# Patient Record
Sex: Female | Born: 1945 | Race: Black or African American | Hispanic: No | Smoking: Former smoker
Health system: Southern US, Community
[De-identification: ages and names within clinical notes are randomized; demographics above are authoritative.]

## PROBLEM LIST (undated history)

## (undated) DIAGNOSIS — C50919 Malignant neoplasm of unspecified site of unspecified female breast: Secondary | ICD-10-CM

## (undated) DIAGNOSIS — Z923 Personal history of irradiation: Secondary | ICD-10-CM

---

## 2000-05-12 ENCOUNTER — Encounter: Payer: Self-pay | Admitting: Family Medicine

## 2000-05-12 ENCOUNTER — Encounter: Admission: RE | Admit: 2000-05-12 | Discharge: 2000-05-12 | Payer: Self-pay | Admitting: Family Medicine

## 2000-05-30 ENCOUNTER — Ambulatory Visit (HOSPITAL_COMMUNITY): Admission: RE | Admit: 2000-05-30 | Discharge: 2000-05-30 | Payer: Self-pay | Admitting: Otolaryngology

## 2000-05-31 ENCOUNTER — Encounter: Payer: Self-pay | Admitting: Otolaryngology

## 2006-05-03 ENCOUNTER — Encounter: Payer: Self-pay | Admitting: Family Medicine

## 2006-05-03 LAB — CONVERTED CEMR LAB: Pap Smear: NORMAL

## 2007-01-23 ENCOUNTER — Encounter: Payer: Self-pay | Admitting: Family Medicine

## 2007-01-23 ENCOUNTER — Ambulatory Visit: Payer: Self-pay | Admitting: Family Medicine

## 2007-01-23 DIAGNOSIS — F3289 Other specified depressive episodes: Secondary | ICD-10-CM | POA: Insufficient documentation

## 2007-01-23 DIAGNOSIS — E039 Hypothyroidism, unspecified: Secondary | ICD-10-CM | POA: Insufficient documentation

## 2007-01-23 DIAGNOSIS — K5900 Constipation, unspecified: Secondary | ICD-10-CM | POA: Insufficient documentation

## 2007-01-23 DIAGNOSIS — F329 Major depressive disorder, single episode, unspecified: Secondary | ICD-10-CM | POA: Insufficient documentation

## 2007-01-23 DIAGNOSIS — F411 Generalized anxiety disorder: Secondary | ICD-10-CM | POA: Insufficient documentation

## 2007-01-23 DIAGNOSIS — J309 Allergic rhinitis, unspecified: Secondary | ICD-10-CM | POA: Insufficient documentation

## 2007-01-23 DIAGNOSIS — L408 Other psoriasis: Secondary | ICD-10-CM | POA: Insufficient documentation

## 2007-01-23 LAB — CONVERTED CEMR LAB: TSH: 1.22 microintl units/mL (ref 0.35–5.50)

## 2007-02-12 ENCOUNTER — Ambulatory Visit: Payer: Self-pay | Admitting: Internal Medicine

## 2007-03-01 ENCOUNTER — Ambulatory Visit: Payer: Self-pay | Admitting: Internal Medicine

## 2007-03-13 ENCOUNTER — Encounter: Payer: Self-pay | Admitting: Internal Medicine

## 2007-03-13 ENCOUNTER — Encounter: Payer: Self-pay | Admitting: Family Medicine

## 2007-03-13 ENCOUNTER — Ambulatory Visit: Payer: Self-pay | Admitting: Internal Medicine

## 2007-03-13 DIAGNOSIS — K5730 Diverticulosis of large intestine without perforation or abscess without bleeding: Secondary | ICD-10-CM | POA: Insufficient documentation

## 2007-03-13 DIAGNOSIS — D126 Benign neoplasm of colon, unspecified: Secondary | ICD-10-CM

## 2007-10-22 ENCOUNTER — Ambulatory Visit: Payer: Self-pay | Admitting: Family Medicine

## 2008-09-10 ENCOUNTER — Encounter: Payer: Self-pay | Admitting: Family Medicine

## 2008-09-10 DIAGNOSIS — J019 Acute sinusitis, unspecified: Secondary | ICD-10-CM | POA: Insufficient documentation

## 2009-10-03 DIAGNOSIS — C50919 Malignant neoplasm of unspecified site of unspecified female breast: Secondary | ICD-10-CM

## 2009-10-03 DIAGNOSIS — Z923 Personal history of irradiation: Secondary | ICD-10-CM

## 2009-10-03 HISTORY — DX: Personal history of irradiation: Z92.3

## 2009-10-03 HISTORY — DX: Malignant neoplasm of unspecified site of unspecified female breast: C50.919

## 2009-10-03 HISTORY — PX: BREAST BIOPSY: SHX20

## 2010-10-05 ENCOUNTER — Ambulatory Visit: Payer: Self-pay | Admitting: Surgery

## 2010-10-15 ENCOUNTER — Ambulatory Visit: Payer: Self-pay | Admitting: Surgery

## 2010-10-21 ENCOUNTER — Ambulatory Visit: Payer: Self-pay | Admitting: Surgery

## 2010-10-26 LAB — PATHOLOGY REPORT

## 2010-11-02 ENCOUNTER — Ambulatory Visit: Payer: Self-pay | Admitting: Surgery

## 2010-11-05 LAB — PATHOLOGY REPORT

## 2010-11-11 ENCOUNTER — Ambulatory Visit: Payer: Self-pay | Admitting: Internal Medicine

## 2010-11-18 ENCOUNTER — Ambulatory Visit: Payer: Self-pay | Admitting: Internal Medicine

## 2010-11-19 LAB — CANCER ANTIGEN 27.29: CA 27.29: 20.1 U/mL (ref 0.0–38.6)

## 2010-12-02 ENCOUNTER — Ambulatory Visit: Payer: Self-pay | Admitting: Internal Medicine

## 2011-01-02 ENCOUNTER — Ambulatory Visit: Payer: Self-pay | Admitting: Internal Medicine

## 2011-01-03 ENCOUNTER — Ambulatory Visit: Payer: Self-pay | Admitting: Radiation Oncology

## 2011-02-01 ENCOUNTER — Ambulatory Visit: Payer: Self-pay | Admitting: Internal Medicine

## 2011-02-01 ENCOUNTER — Ambulatory Visit: Payer: Self-pay | Admitting: Radiation Oncology

## 2011-02-15 NOTE — Assessment & Plan Note (Signed)
[facility]                         GASTROENTEROLOGY OFFICE NOTE   ANNALYSA, MOHAMMAD                    MRN:          [id number]  DATE:03/01/2007                            DOB:          1946/09/06    CHIEF COMPLAINT:  Abdominal pain.   Ms. Fragoso was seen by our nurse, preparing for a colonoscopy, and she  was describing some left upper quadrant pain, so per protocol, she was  sent for an evaluation with a physician prior to her procedure.  She is  describing left upper quadrant and some left lower quadrant pain  that  is fairly intense at times that is relieved with defecation.  Dr.  Ermalene Searing did start her on some fiber and MiraLax, and that has helped  things.  There is no bleeding.  There is no obvious change in stool  caliber.  I think she has intentionally lost some weight.  Please see my  medical history form for further details.  There is some rare heartburn  symptomatology, usually just related to certain types of foods, like  spicy foods and tomato-based foods.   MEDICATIONS:  Multivitamins daily, calcium 600 plus D b.i.d., Synthroid  88 mcg twice daily.   PAST MEDICAL HISTORY:  1. Hypothyroidism.  2. Sleep apnea.  3. Depression.   No previous surgeries.   No known drug allergies.   FAMILY HISTORY:  Heart disease, breast cancer noted.  No colon cancer.   SOCIAL HISTORY:  She is married.  She is a Geographical information systems officer.  She lives  with her husband and one daughter.  No alcohol, tobacco, or drugs.   REVIEW OF SYSTEMS:  See my medical history form.   PHYSICAL EXAMINATION:  VITAL SIGNS:  Height 5 feet 3.  Weight 137  pounds.  Blood pressure 134/68, pulse 80.  GENERAL:  A well-developed and well-nourished black woman in no acute  distress.  HEENT:  Eyes anicteric.  ENT:  Normal mouth, lips, and pharynx.  NECK:  Supple without mass.  CHEST:  Clear.  HEART:  S1 and S2.  No murmurs, rubs or gallops.  ABDOMEN:  Soft and nontender.   No organomegaly.  SKIN:  Warm and dry with no rash.  PSYCH:  She is alert and oriented x3.   ASSESSMENT:  Left lower quadrant pain and some difficulty with  defecation, constipation-like symptoms.   PLAN:  Go ahead with the colonoscopy.  I do not think we need any other  studies before that.  She seems to be helped by the fiber.  Certainly,  diverticular disease and colorectal neoplasia are high on the list of  possibilities, although statistically, colorectal neoplasia is less  likely.  It needs to be excluded.  Risks, benefits and indications are  explained.  She understands and agrees to proceed.  Currently, the  colonoscopy is planned for March 13, 2007 at 4:00 p.m.     Iva Boop, MD,FACG  Electronically Signed    CEG/MedQ  DD: 03/01/2007  DT: 03/02/2007  Job #: [id number]   cc:   Kerby Nora, MD

## 2011-03-04 ENCOUNTER — Ambulatory Visit: Payer: Self-pay | Admitting: Radiation Oncology

## 2011-03-04 ENCOUNTER — Ambulatory Visit: Payer: Self-pay | Admitting: Internal Medicine

## 2011-04-07 ENCOUNTER — Ambulatory Visit: Payer: Self-pay | Admitting: Internal Medicine

## 2011-04-08 LAB — CANCER ANTIGEN 27.29: CA 27.29: 18.8 U/mL (ref 0.0–38.6)

## 2011-05-04 ENCOUNTER — Ambulatory Visit: Payer: Self-pay | Admitting: Internal Medicine

## 2011-07-07 ENCOUNTER — Ambulatory Visit: Payer: Self-pay | Admitting: Surgery

## 2011-08-09 ENCOUNTER — Ambulatory Visit: Payer: Self-pay | Admitting: Internal Medicine

## 2011-09-03 ENCOUNTER — Ambulatory Visit: Payer: Self-pay | Admitting: Internal Medicine

## 2011-10-13 ENCOUNTER — Ambulatory Visit: Payer: Self-pay | Admitting: Internal Medicine

## 2011-11-04 ENCOUNTER — Ambulatory Visit: Payer: Self-pay | Admitting: Internal Medicine

## 2012-01-24 ENCOUNTER — Encounter: Payer: Self-pay | Admitting: Internal Medicine

## 2012-03-05 ENCOUNTER — Ambulatory Visit: Payer: Self-pay | Admitting: Oncology

## 2012-03-05 LAB — COMPREHENSIVE METABOLIC PANEL
Alkaline Phosphatase: 52 U/L (ref 50–136)
Calcium, Total: 9.2 mg/dL (ref 8.5–10.1)
Chloride: 103 mmol/L (ref 98–107)
Glucose: 87 mg/dL (ref 65–99)
Osmolality: 276 (ref 275–301)
SGOT(AST): 17 U/L (ref 15–37)
SGPT (ALT): 15 U/L
Total Protein: 7.3 g/dL (ref 6.4–8.2)

## 2012-03-05 LAB — CBC CANCER CENTER
Basophil #: 0 x10 3/mm (ref 0.0–0.1)
Eosinophil #: 0 x10 3/mm (ref 0.0–0.7)
Eosinophil %: 0.8 %
HGB: 13.7 g/dL (ref 12.0–16.0)
Lymphocyte %: 27.6 %
MCV: 94 fL (ref 80–100)
Monocyte #: 0.4 x10 3/mm (ref 0.2–0.9)
Neutrophil #: 2.9 x10 3/mm (ref 1.4–6.5)
Neutrophil %: 62.8 %
Platelet: 239 x10 3/mm (ref 150–440)
RBC: 4.46 10*6/uL (ref 3.80–5.20)
RDW: 13.1 % (ref 11.5–14.5)
WBC: 4.6 x10 3/mm (ref 3.6–11.0)

## 2012-03-06 LAB — CANCER ANTIGEN 27.29: CA 27.29: 21 U/mL (ref 0.0–38.6)

## 2012-04-02 ENCOUNTER — Ambulatory Visit: Payer: Self-pay | Admitting: Oncology

## 2012-07-09 ENCOUNTER — Ambulatory Visit: Payer: Self-pay | Admitting: Surgery

## 2012-09-04 ENCOUNTER — Ambulatory Visit: Payer: Self-pay | Admitting: Oncology

## 2012-09-04 LAB — CBC CANCER CENTER
Basophil #: 0 x10 3/mm (ref 0.0–0.1)
Basophil %: 0.8 %
Eosinophil #: 0 x10 3/mm (ref 0.0–0.7)
HCT: 38.8 % (ref 35.0–47.0)
HGB: 13.1 g/dL (ref 12.0–16.0)
Lymphocyte #: 1.2 x10 3/mm (ref 1.0–3.6)
Lymphocyte %: 24.1 %
MCH: 31 pg (ref 26.0–34.0)
MCHC: 33.7 g/dL (ref 32.0–36.0)
MCV: 92 fL (ref 80–100)
Neutrophil #: 3.5 x10 3/mm (ref 1.4–6.5)
Neutrophil %: 67.2 %
RBC: 4.21 10*6/uL (ref 3.80–5.20)
RDW: 13.1 % (ref 11.5–14.5)

## 2012-09-04 LAB — COMPREHENSIVE METABOLIC PANEL
Alkaline Phosphatase: 54 U/L (ref 50–136)
BUN: 10 mg/dL (ref 7–18)
Chloride: 104 mmol/L (ref 98–107)
Co2: 26 mmol/L (ref 21–32)
Creatinine: 0.9 mg/dL (ref 0.60–1.30)
EGFR (African American): >60
SGPT (ALT): 20 U/L (ref 12–78)
Sodium: 141 mmol/L (ref 136–145)
Total Protein: 6.8 g/dL (ref 6.4–8.2)

## 2012-09-30 IMAGING — US US OUTSIDE FILMS BREAST
1 series · 18 of 23 positions shown · non-contrast
Comparison: none

[Series 2: us outside films breast · 18 of 23 slices shown]
[im 1/23]
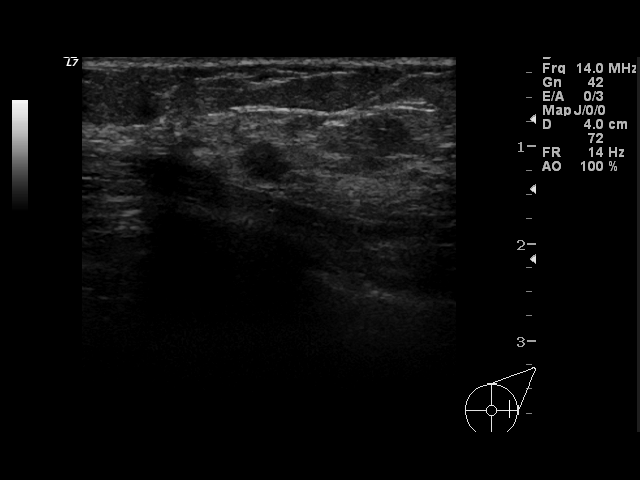
[im 2/23]
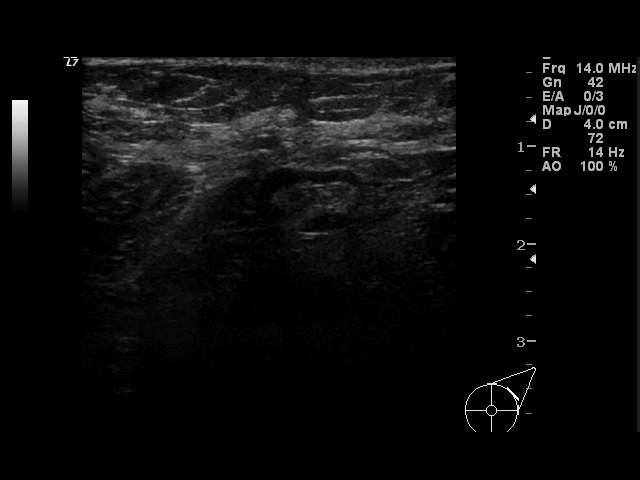
[im 4/23]
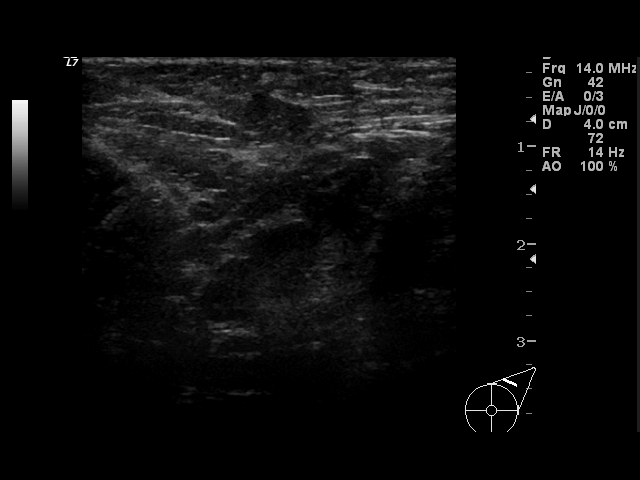
[im 5/23]
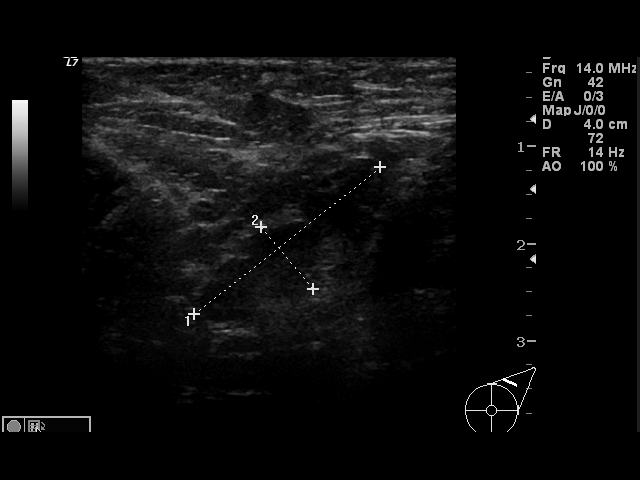
[im 6/23]
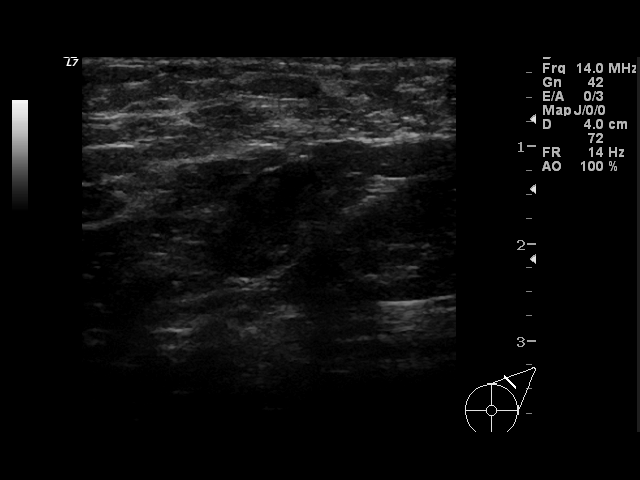
[im 8/23]
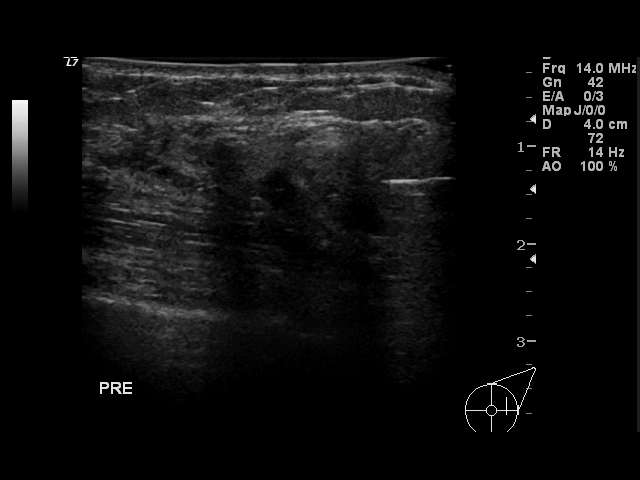
[im 9/23]
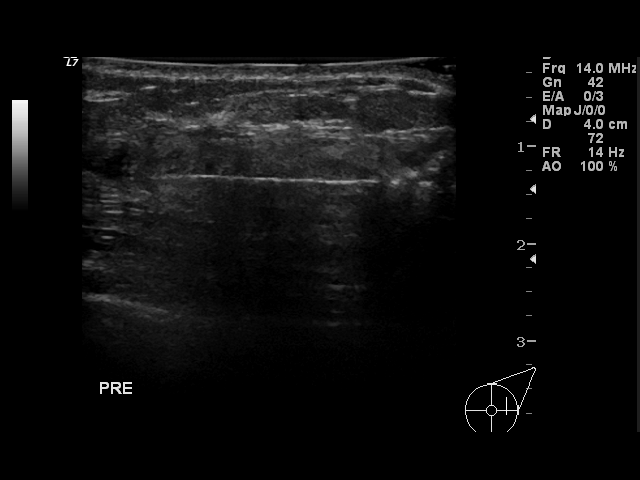
[im 10/23]
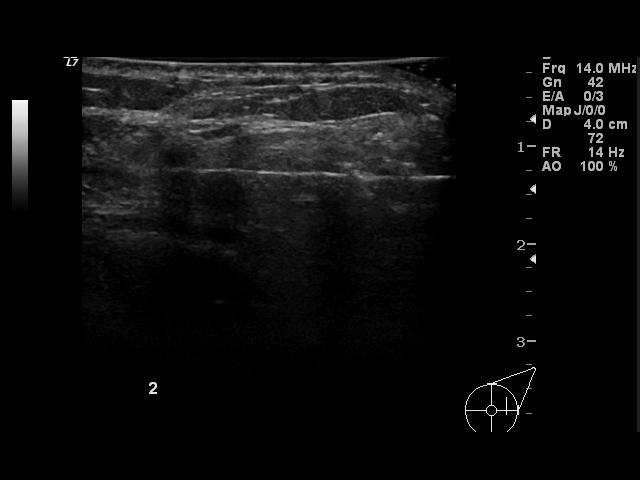
[im 11/23]
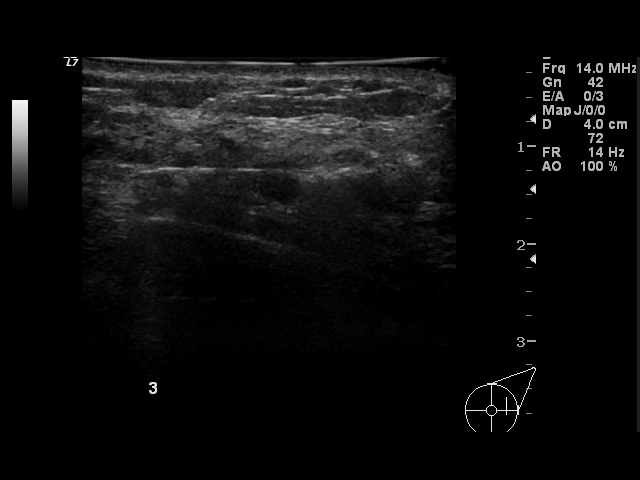
[im 13/23]
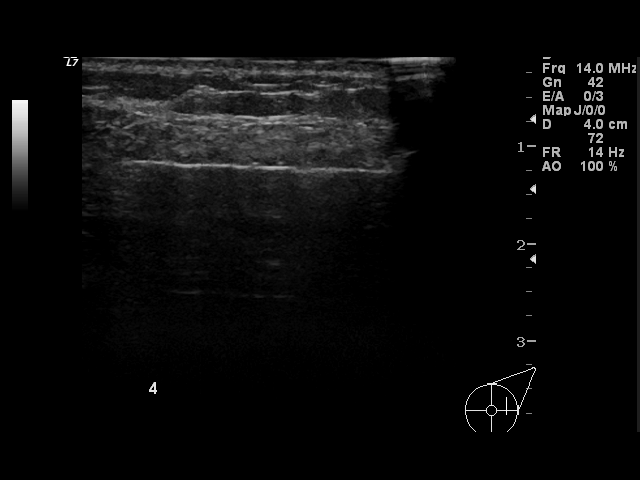
[im 14/23]
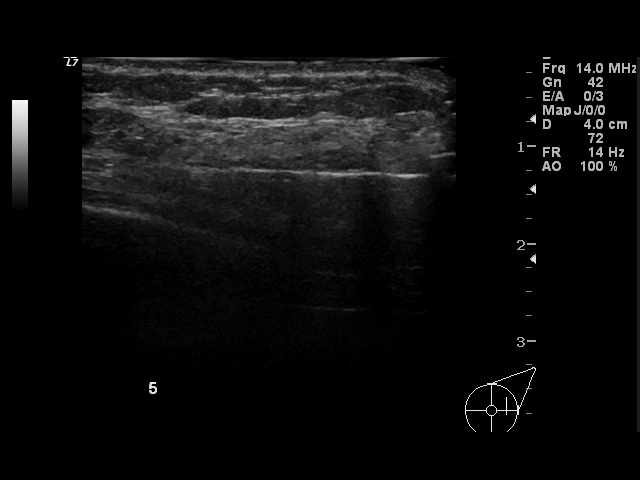
[im 15/23]
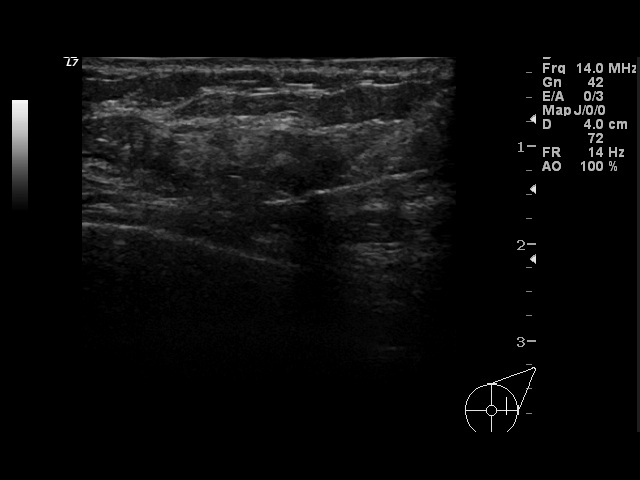
[im 16/23]
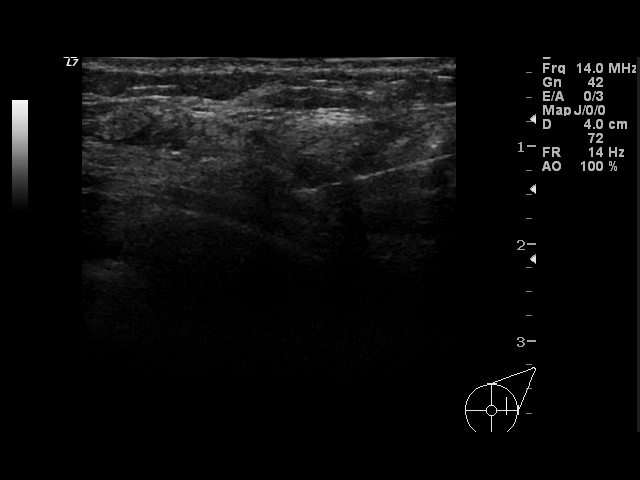
[im 18/23]
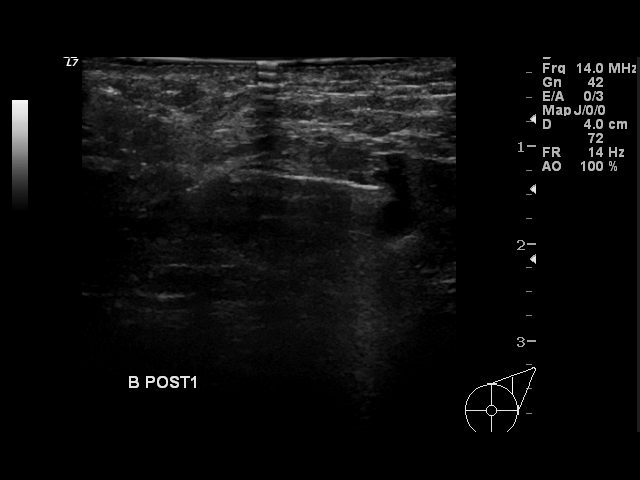
[im 19/23]
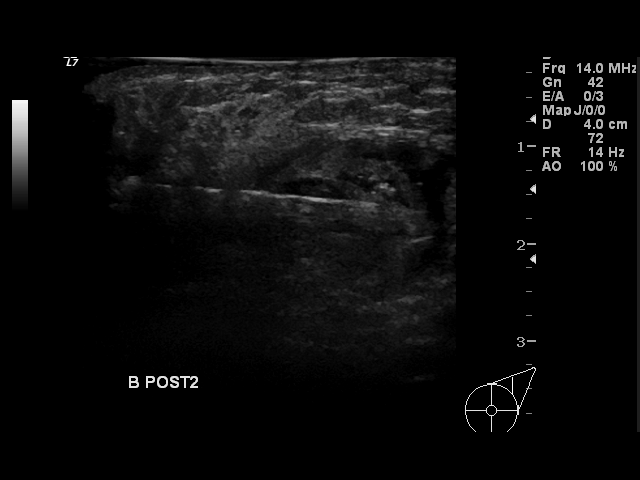
[im 20/23]
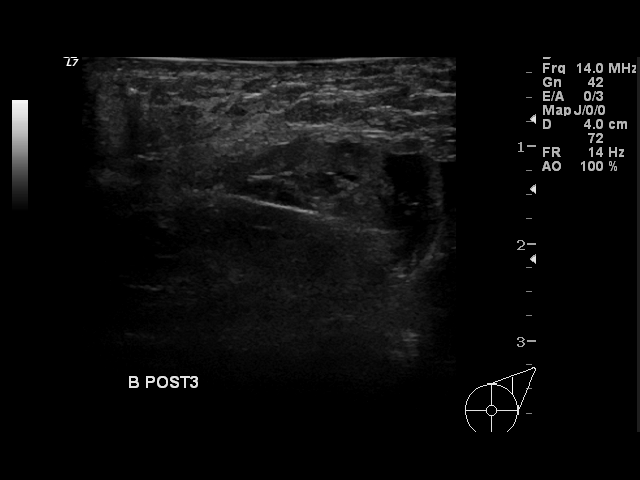
[im 22/23]
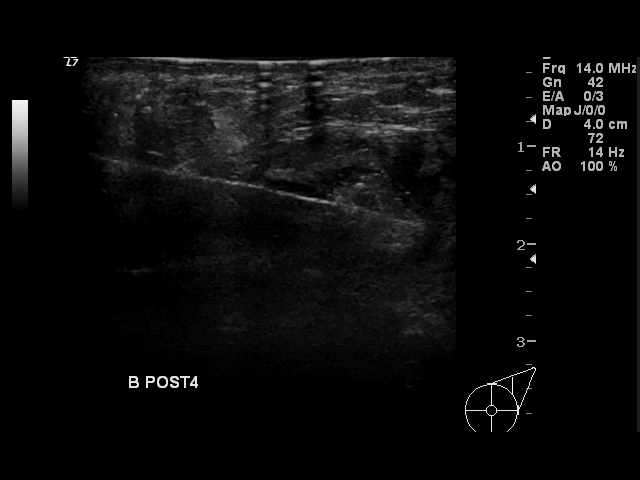
[im 23/23]
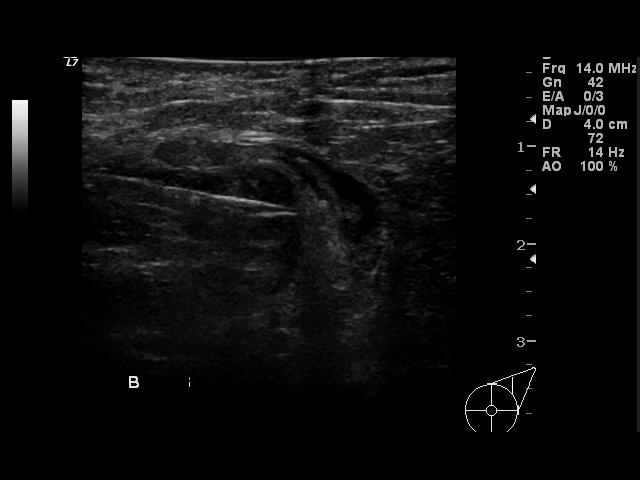

[18 of 23 positions shown; findings below may reference images not displayed]

IMAGES IMPORTED FROM THE SYNGO WORKFLOW SYSTEM
NO DICTATION FOR STUDY

## 2012-10-03 ENCOUNTER — Ambulatory Visit: Payer: Self-pay | Admitting: Oncology

## 2012-11-03 ENCOUNTER — Ambulatory Visit: Payer: Self-pay | Admitting: Oncology

## 2012-11-30 ENCOUNTER — Ambulatory Visit: Payer: Self-pay | Admitting: Unknown Physician Specialty

## 2012-12-04 LAB — PATHOLOGY REPORT

## 2013-01-02 ENCOUNTER — Encounter: Payer: Self-pay | Admitting: Internal Medicine

## 2013-01-30 ENCOUNTER — Ambulatory Visit: Payer: Self-pay

## 2013-01-30 LAB — HEMATOCRIT: HCT: 40.8 % (ref 35.0–47.0)

## 2013-02-01 ENCOUNTER — Ambulatory Visit: Payer: Self-pay

## 2013-02-05 LAB — PATHOLOGY REPORT

## 2013-03-04 ENCOUNTER — Ambulatory Visit: Payer: Self-pay | Admitting: Oncology

## 2013-03-05 LAB — COMPREHENSIVE METABOLIC PANEL
Albumin: 3.4 g/dL (ref 3.4–5.0)
Anion Gap: 8 (ref 7–16)
Co2: 27 mmol/L (ref 21–32)
Creatinine: 0.9 mg/dL (ref 0.60–1.30)
EGFR (African American): >60
EGFR (Non-African Amer.): >60
Glucose: 90 mg/dL (ref 65–99)
Osmolality: 277 (ref 275–301)
Potassium: 4 mmol/L (ref 3.5–5.1)
SGPT (ALT): 19 U/L (ref 12–78)
Total Protein: 7.1 g/dL (ref 6.4–8.2)

## 2013-03-05 LAB — CBC CANCER CENTER
HCT: 41.6 % (ref 35.0–47.0)
HGB: 14.3 g/dL (ref 12.0–16.0)
Lymphocyte %: 21 %
MCH: 31.3 pg (ref 26.0–34.0)
MCHC: 34.4 g/dL (ref 32.0–36.0)
Monocyte #: 0.4 x10 3/mm (ref 0.2–0.9)
Monocyte %: 6.9 %
Neutrophil #: 3.7 x10 3/mm (ref 1.4–6.5)
Platelet: 241 x10 3/mm (ref 150–440)
RBC: 4.58 10*6/uL (ref 3.80–5.20)
WBC: 5.3 x10 3/mm (ref 3.6–11.0)

## 2013-04-02 ENCOUNTER — Ambulatory Visit: Payer: Self-pay | Admitting: Oncology

## 2013-07-10 ENCOUNTER — Ambulatory Visit: Payer: Self-pay | Admitting: Surgery

## 2013-09-03 ENCOUNTER — Ambulatory Visit: Payer: Self-pay | Admitting: Oncology

## 2013-09-03 LAB — CBC CANCER CENTER
Basophil %: 0.6 %
HCT: 43.3 % (ref 35.0–47.0)
Lymphocyte #: 1.5 x10 3/mm (ref 1.0–3.6)
MCV: 92 fL (ref 80–100)
Neutrophil #: 3.1 x10 3/mm (ref 1.4–6.5)
Platelet: 253 x10 3/mm (ref 150–440)
RDW: 13.3 % (ref 11.5–14.5)

## 2013-09-03 LAB — COMPREHENSIVE METABOLIC PANEL
Albumin: 3.4 g/dL (ref 3.4–5.0)
Calcium, Total: 9.9 mg/dL (ref 8.5–10.1)
Chloride: 104 mmol/L (ref 98–107)
Creatinine: 0.81 mg/dL (ref 0.60–1.30)
EGFR (African American): >60
EGFR (Non-African Amer.): >60
Glucose: 94 mg/dL (ref 65–99)
Osmolality: 279 (ref 275–301)
SGOT(AST): 17 U/L (ref 15–37)
Sodium: 140 mmol/L (ref 136–145)
Total Protein: 7.2 g/dL (ref 6.4–8.2)

## 2013-10-03 ENCOUNTER — Ambulatory Visit: Payer: Self-pay | Admitting: Oncology

## 2014-04-07 ENCOUNTER — Ambulatory Visit: Payer: Self-pay | Admitting: Oncology

## 2014-04-07 LAB — CBC CANCER CENTER
Basophil #: 0 x10 3/mm (ref 0.0–0.1)
Basophil %: 0.7 %
Eosinophil #: 0 x10 3/mm (ref 0.0–0.7)
Eosinophil %: 0.9 %
HCT: 42.5 % (ref 35.0–47.0)
HGB: 14.2 g/dL (ref 12.0–16.0)
LYMPHS ABS: 1.5 x10 3/mm (ref 1.0–3.6)
LYMPHS PCT: 29 %
MCH: 31 pg (ref 26.0–34.0)
MCHC: 33.4 g/dL (ref 32.0–36.0)
MCV: 93 fL (ref 80–100)
Monocyte #: 0.4 x10 3/mm (ref 0.2–0.9)
Monocyte %: 7.1 %
NEUTROS PCT: 62.3 %
Neutrophil #: 3.3 x10 3/mm (ref 1.4–6.5)
Platelet: 248 x10 3/mm (ref 150–440)
RBC: 4.59 10*6/uL (ref 3.80–5.20)
RDW: 13.5 % (ref 11.5–14.5)
WBC: 5.2 x10 3/mm (ref 3.6–11.0)

## 2014-04-07 LAB — COMPREHENSIVE METABOLIC PANEL
ALK PHOS: 78 U/L
AST: 14 U/L — AB (ref 15–37)
Albumin: 3.4 g/dL (ref 3.4–5.0)
Anion Gap: 6 — ABNORMAL LOW (ref 7–16)
BILIRUBIN TOTAL: 0.6 mg/dL (ref 0.2–1.0)
BUN: 11 mg/dL (ref 7–18)
CALCIUM: 9 mg/dL (ref 8.5–10.1)
CHLORIDE: 105 mmol/L (ref 98–107)
Co2: 30 mmol/L (ref 21–32)
Creatinine: 0.72 mg/dL (ref 0.60–1.30)
EGFR (African American): >60
EGFR (Non-African Amer.): >60
Glucose: 97 mg/dL (ref 65–99)
Osmolality: 281 (ref 275–301)
POTASSIUM: 3.9 mmol/L (ref 3.5–5.1)
SGPT (ALT): 18 U/L (ref 12–78)
Sodium: 141 mmol/L (ref 136–145)
Total Protein: 7.2 g/dL (ref 6.4–8.2)

## 2014-05-03 ENCOUNTER — Ambulatory Visit: Payer: Self-pay | Admitting: Oncology

## 2014-09-11 ENCOUNTER — Ambulatory Visit: Payer: Self-pay | Admitting: Surgery

## 2014-11-04 ENCOUNTER — Ambulatory Visit: Payer: Self-pay | Admitting: Oncology

## 2014-11-04 LAB — COMPREHENSIVE METABOLIC PANEL
ALT: 23 U/L (ref 14–63)
ANION GAP: 11 (ref 7–16)
Albumin: 3.4 g/dL (ref 3.4–5.0)
Alkaline Phosphatase: 79 U/L (ref 46–116)
BILIRUBIN TOTAL: 0.6 mg/dL (ref 0.2–1.0)
BUN: 10 mg/dL (ref 7–18)
CHLORIDE: 103 mmol/L (ref 98–107)
CREATININE: 0.71 mg/dL (ref 0.60–1.30)
Calcium, Total: 8.6 mg/dL (ref 8.5–10.1)
Co2: 26 mmol/L (ref 21–32)
EGFR (African American): >60
EGFR (Non-African Amer.): >60
Glucose: 83 mg/dL (ref 65–99)
Osmolality: 278 (ref 275–301)
POTASSIUM: 3.9 mmol/L (ref 3.5–5.1)
SGOT(AST): 15 U/L (ref 15–37)
Sodium: 140 mmol/L (ref 136–145)
TOTAL PROTEIN: 7.3 g/dL (ref 6.4–8.2)

## 2014-11-04 LAB — CBC CANCER CENTER
BASOS ABS: 0 x10 3/mm (ref 0.0–0.1)
BASOS PCT: 0.6 %
EOS ABS: 0.1 x10 3/mm (ref 0.0–0.7)
Eosinophil %: 1.1 %
HCT: 43.4 % (ref 35.0–47.0)
HGB: 14.6 g/dL (ref 12.0–16.0)
LYMPHS ABS: 1.9 x10 3/mm (ref 1.0–3.6)
LYMPHS PCT: 30.8 %
MCH: 30.9 pg (ref 26.0–34.0)
MCHC: 33.8 g/dL (ref 32.0–36.0)
MCV: 91 fL (ref 80–100)
MONOS PCT: 7.3 %
Monocyte #: 0.4 x10 3/mm (ref 0.2–0.9)
Neutrophil #: 3.6 x10 3/mm (ref 1.4–6.5)
Neutrophil %: 60.2 %
Platelet: 259 x10 3/mm (ref 150–440)
RBC: 4.75 10*6/uL (ref 3.80–5.20)
RDW: 13.3 % (ref 11.5–14.5)
WBC: 6 x10 3/mm (ref 3.6–11.0)

## 2014-12-02 ENCOUNTER — Ambulatory Visit
Admit: 2014-12-02 | Disposition: A | Discharge status: Home / Self Care | Payer: Self-pay | Attending: Oncology | Admitting: Oncology

## 2015-01-23 NOTE — Op Note (Signed)
PATIENT NAME:  Dawn Horne, Dawn Horne MR#:  [id number] DATE OF BIRTH:  [birth year]  DATE OF PROCEDURE:  02/01/2013  PREOPERATIVE DIAGNOSIS:  Post-menopausal vaginal bleeding.  POSTOPERATIVE DIAGNOSIS:  Post-menopausal vaginal bleeding.  OPERATION PERFORMED:   Dilation and curettage.   SURGEON:  Wonda Cheng. Laurey Morale, MD.  OPERATIVE FINDINGS:  Uterus is retroverted.  It was difficult to sound the uterine cavity.  A small amount of tissue on endometrial curettage.   DESCRIPTION OF PROCEDURE:  After adequate general anesthesia, the patient was prepped and draped in a routine fashion.  Examination revealed the above exam.  The cervix was grasped with jacobs tenaculum.  It was difficult to sound the uterus.   Ultimately, uterus was able to be sounded and minimally dilated.  The uterine cavity was systematically curetted with return of minimal amount of tissue.  The patient tolerated the procedure well, left the operating room in good condition.  Sponge and needle counts were said to be correct at the end of the procedure.     ____________________________ Wonda Cheng. Laurey Morale, MD pjr:ea D: 02/01/2013 14:29:25 ET T: 02/01/2013 23:08:49 ET JOB#: [id number]  cc: Wonda Cheng. Laurey Morale, MD, <Dictator> Rosina Lowenstein MD ELECTRONICALLY SIGNED 02/04/2013 7:00

## 2015-05-01 ENCOUNTER — Other Ambulatory Visit: Payer: Self-pay | Admitting: *Deleted

## 2015-05-01 DIAGNOSIS — C50919 Malignant neoplasm of unspecified site of unspecified female breast: Secondary | ICD-10-CM

## 2015-05-05 ENCOUNTER — Inpatient Hospital Stay: Payer: Medicare Other | Admitting: Oncology

## 2015-05-05 ENCOUNTER — Inpatient Hospital Stay: Payer: Medicare Other | Attending: Oncology

## 2015-05-05 DIAGNOSIS — Z87891 Personal history of nicotine dependence: Secondary | ICD-10-CM | POA: Insufficient documentation

## 2015-05-05 DIAGNOSIS — Z853 Personal history of malignant neoplasm of breast: Secondary | ICD-10-CM | POA: Insufficient documentation

## 2015-05-05 DIAGNOSIS — M818 Other osteoporosis without current pathological fracture: Secondary | ICD-10-CM | POA: Insufficient documentation

## 2015-05-12 ENCOUNTER — Inpatient Hospital Stay: Payer: Medicare Other

## 2015-05-12 ENCOUNTER — Inpatient Hospital Stay (HOSPITAL_BASED_OUTPATIENT_CLINIC_OR_DEPARTMENT_OTHER): Payer: Medicare Other | Admitting: Oncology

## 2015-05-12 ENCOUNTER — Encounter: Payer: Self-pay | Admitting: Oncology

## 2015-05-12 VITALS — BP 120/86 | HR 90 | Temp 95.7°F | Wt 138.4 lb

## 2015-05-12 DIAGNOSIS — C50919 Malignant neoplasm of unspecified site of unspecified female breast: Secondary | ICD-10-CM

## 2015-05-12 DIAGNOSIS — Z853 Personal history of malignant neoplasm of breast: Secondary | ICD-10-CM | POA: Diagnosis not present

## 2015-05-12 DIAGNOSIS — Z87891 Personal history of nicotine dependence: Secondary | ICD-10-CM | POA: Diagnosis not present

## 2015-05-12 DIAGNOSIS — M818 Other osteoporosis without current pathological fracture: Secondary | ICD-10-CM

## 2015-05-12 DIAGNOSIS — C50912 Malignant neoplasm of unspecified site of left female breast: Secondary | ICD-10-CM

## 2015-05-12 LAB — CBC WITH DIFFERENTIAL/PLATELET
Basophils Absolute: 0.1 10*3/uL (ref 0–0.1)
Basophils Relative: 1 %
Eosinophils Absolute: 0.1 10*3/uL (ref 0–0.7)
Eosinophils Relative: 1 %
HCT: 44.1 % (ref 35.0–47.0)
Hemoglobin: 15 g/dL (ref 12.0–16.0)
LYMPHS PCT: 32 %
Lymphs Abs: 2.4 10*3/uL (ref 1.0–3.6)
MCH: 30.9 pg (ref 26.0–34.0)
MCHC: 34.1 g/dL (ref 32.0–36.0)
MCV: 90.8 fL (ref 80.0–100.0)
MONOS PCT: 7 %
Monocytes Absolute: 0.5 10*3/uL (ref 0.2–0.9)
Neutro Abs: 4.4 10*3/uL (ref 1.4–6.5)
Neutrophils Relative %: 59 %
PLATELETS: 287 10*3/uL (ref 150–440)
RBC: 4.86 MIL/uL (ref 3.80–5.20)
RDW: 13.5 % (ref 11.5–14.5)
WBC: 7.4 10*3/uL (ref 3.6–11.0)

## 2015-05-12 LAB — COMPREHENSIVE METABOLIC PANEL
ALT: 14 U/L (ref 14–54)
ANION GAP: 6 (ref 5–15)
AST: 17 U/L (ref 15–41)
Albumin: 4.1 g/dL (ref 3.5–5.0)
Alkaline Phosphatase: 73 U/L (ref 38–126)
BILIRUBIN TOTAL: 0.6 mg/dL (ref 0.3–1.2)
BUN: 16 mg/dL (ref 6–20)
CHLORIDE: 102 mmol/L (ref 101–111)
CO2: 24 mmol/L (ref 22–32)
Calcium: 9.1 mg/dL (ref 8.9–10.3)
Creatinine, Ser: 0.68 mg/dL (ref 0.44–1.00)
GFR calc Af Amer: >60 mL/min (ref >60)
GFR calc non Af Amer: >60 mL/min (ref >60)
Glucose, Bld: 97 mg/dL (ref 65–99)
Potassium: 4 mmol/L (ref 3.5–5.1)
Sodium: 132 mmol/L — ABNORMAL LOW (ref 135–145)
Total Protein: 7.8 g/dL (ref 6.5–8.1)

## 2015-05-12 MED ORDER — LETROZOLE 2.5 MG PO TABS: 2.5000 mg | ORAL_TABLET | Freq: Every day | ORAL | Status: DC

## 2015-05-12 NOTE — Progress Notes (Signed)
Patient does not have living will.  Materials given.  Former smoker.

## 2015-05-17 ENCOUNTER — Encounter: Payer: Self-pay | Admitting: Oncology

## 2015-05-17 DIAGNOSIS — C50412 Malignant neoplasm of upper-outer quadrant of left female breast: Secondary | ICD-10-CM | POA: Insufficient documentation

## 2015-05-17 NOTE — Progress Notes (Signed)
[place] @ [facility] Telephone:(336) [phone]  Fax:(336) [place]: 07-27-1946  MR#: [id number]  UVO#:[id number]  Patient Care Team: Jinny Sanders, MD as PCP - General  CHIEF COMPLAINT:  Chief Complaint  Patient presents with  . Follow-up    1. 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  Site treated scar boost Tumor dose 4040 cGy in 8 fractions Start D. 02/18/11 Completed 03/02/11 Using 12 MV electrons en face prescribed to 90% isodose.  b. Tamoxifen initiated 04/07/11  2. 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 last Dexa scan 05/15/09. 3.on anastrozole (because of proliferative to endometrial biopsy)    INTERVAL HISTORY:  69 year old lady with stage I carcinoma of breast.  Patient is now on letrozole tolerating treatment very well.  Taking calcium and vitamin D getting regular mammograms done No bony pains to bony fracture reported  REVIEW OF SYSTEMS:   GENERAL:  Feels good.  Active.  No fevers, sweats or weight loss. PERFORMANCE STATUS (ECOG):  01 HEENT:  No visual changes, runny nose, sore throat, mouth sores or tenderness. Lungs: No shortness of breath or cough.  No hemoptysis. Cardiac:  No chest pain, palpitations, orthopnea, or PND. GI:  No nausea, vomiting, diarrhea, constipation, melena or hematochezia. GU:  No urgency, frequency, dysuria, or hematuria. Musculoskeletal:  No back pain.  No joint pain.  No muscle tenderness. Extremities:  No pain or swelling. Skin:  No rashes or skin changes. Neuro:  No headache, numbness or weakness, balance or coordination issues. Endocrine:  No diabetes, thyroid issues, hot flashes or night sweats. Psych:  No mood changes, depression or anxiety. Pain:  No focal pain. Review of systems:  All other  systems reviewed and found to be negative. As per HPI. Otherwise, a complete review of systems is negatve.  PAST MEDICAL HISTORY: Past Medical History  Diagnosis Date  . Cancer of left breast 05/17/2015    Allergies:  No Known Allergies:   Significant History/PMH:   Thyroid:    Psorasis:    Cancer, Breast  FAMILY HISTORY There is no significant family history of breast cancer, ovarian cancer, colon cancer ADVANCED DIRECTIVES:  Patient does not have any living will or healthcare power of attorney.  Information was given .  Available resources had been discussed.  We will follow-up on subsequent appointments regarding this issue HEALTH MAINTENANCE: Social History  Substance Use Topics  . Smoking status: Former Research scientist (life sciences)  . Smokeless tobacco: None  . Alcohol Use: None      No Known Allergies  Current Outpatient Prescriptions  Medication Sig Dispense Refill  . letrozole (FEMARA) 2.5 MG tablet Take 1 tablet (2.5 mg total) by mouth daily. 30 tablet 6  . SYNTHROID 88 MCG tablet   0  . Vitamin D, Ergocalciferol, (DRISDOL) 50000 UNITS CAPS capsule   0   No current facility-administered medications for this visit.    OBJECTIVE:  Filed Vitals:   05/12/15 1520  BP: 120/86  Pulse: 90  Temp: 95.7 F (35.4 C)     Body mass index is 24.52 kg/(m^2).    ECOG FS:1 - Symptomatic but completely ambulatory  PHYSICAL EXAM: GENERAL:  Well developed, well nourished, sitting comfortably in the exam room in no acute distress. MENTAL STATUS:  Alert and oriented to person, place and time. Marland Kitchen  ENT:  Oropharynx clear without lesion.  Tongue normal. Mucous membranes moist.  RESPIRATORY:  Clear to auscultation without rales, wheezes or rhonchi. CARDIOVASCULAR:  Regular rate and rhythm without murmur, rub or gallop. BREAST:  Right breast without masses, skin changes or nipple discharge.  Left breast without masses, skin changes or nipple discharge. ABDOMEN:  Soft, non-tender, with active bowel  sounds, and no hepatosplenomegaly.  No masses. BACK:  No CVA tenderness.  No tenderness on percussion of the back or rib cage. SKIN:  No rashes, ulcers or lesions. EXTREMITIES: No edema, no skin discoloration or tenderness.  No palpable cords. LYMPH NODES: No palpable cervical, supraclavicular, axillary or inguinal adenopathy  NEUROLOGICAL: Unremarkable. PSYCH:  Appropriate.   LAB RESULTS:  CBC Latest Ref Rng 05/12/2015 11/04/2014  WBC 3.6 - 11.0 K/uL 7.4 6.0  Hemoglobin 12.0 - 16.0 g/dL 15.0 14.6  Hematocrit 35.0 - 47.0 % 44.1 43.4  Platelets 150 - 440 K/uL 287 259    Appointment on 05/12/2015  Component Date Value Ref Range Status  . WBC 05/12/2015 7.4  3.6 - 11.0 K/uL Final  . RBC 05/12/2015 4.86  3.80 - 5.20 MIL/uL Final  . Hemoglobin 05/12/2015 15.0  12.0 - 16.0 g/dL Final  . HCT 05/12/2015 44.1  35.0 - 47.0 % Final  . MCV 05/12/2015 90.8  80.0 - 100.0 fL Final  . MCH 05/12/2015 30.9  26.0 - 34.0 pg Final  . MCHC 05/12/2015 34.1  32.0 - 36.0 g/dL Final  . RDW 05/12/2015 13.5  11.5 - 14.5 % Final  . Platelets 05/12/2015 287  150 - 440 K/uL Final  . Neutrophils Relative % 05/12/2015 59   Final  . Neutro Abs 05/12/2015 4.4  1.4 - 6.5 K/uL Final  . Lymphocytes Relative 05/12/2015 32   Final  . Lymphs Abs 05/12/2015 2.4  1.0 - 3.6 K/uL Final  . Monocytes Relative 05/12/2015 7   Final  . Monocytes Absolute 05/12/2015 0.5  0.2 - 0.9 K/uL Final  . Eosinophils Relative 05/12/2015 1   Final  . Eosinophils Absolute 05/12/2015 0.1  0 - 0.7 K/uL Final  . Basophils Relative 05/12/2015 1   Final  . Basophils Absolute 05/12/2015 0.1  0 - 0.1 K/uL Final  . Sodium 05/12/2015 132* 135 - 145 mmol/L Final  . Potassium 05/12/2015 4.0  3.5 - 5.1 mmol/L Final  . Chloride 05/12/2015 102  101 - 111 mmol/L Final  . CO2 05/12/2015 24  22 - 32 mmol/L Final  . Glucose, Bld 05/12/2015 97  65 - 99 mg/dL Final  . BUN 05/12/2015 16  6 - 20 mg/dL Final  . Creatinine, Ser 05/12/2015 0.68  0.44 - 1.00 mg/dL  Final  . Calcium 05/12/2015 9.1  8.9 - 10.3 mg/dL Final  . Total Protein 05/12/2015 7.8  6.5 - 8.1 g/dL Final  . Albumin 05/12/2015 4.1  3.5 - 5.0 g/dL Final  . AST 05/12/2015 17  15 - 41 U/L Final  . ALT 05/12/2015 14  14 - 54 U/L Final  . Alkaline Phosphatase 05/12/2015 73  38 - 126 U/L Final  . Total Bilirubin 05/12/2015 0.6  0.3 - 1.2 mg/dL Final  . GFR calc non Af Amer 05/12/2015 >60  >60 mL/min Final  . GFR calc Af Amer 05/12/2015 >60  >60 mL/min Final   Comment: (NOTE) The eGFR has been calculated using the CKD EPI equation. This calculation has not been validated in all clinical situations. eGFR's persistently <60 mL/min signify possible Chronic Kidney Disease.   Marland Kitchen  Anion gap 05/12/2015 6  5 - 15 Final        ASSESSMENT: Carcinoma of breast stage I disease estrogen and progesterone receptor positive.  We will advise patient to stop taking letrozole after she finishes present supply.   MEDICAL DECISION MAKING:  Patient has finished 5 years of letrozole and anti-hormonal therapy was advised to stop Ollen Barges being arranged Patient expressed understanding and was in agreement with this plan. She also understands that She can call clinic at any time with any questions, concerns, or complaints.    No matching staging information was found for the patient.  Forest Gleason, MD   05/17/2015 7:30 AM

## 2015-08-24 ENCOUNTER — Other Ambulatory Visit: Payer: Self-pay | Admitting: Oncology

## 2015-09-15 ENCOUNTER — Ambulatory Visit
Admission: RE | Admit: 2015-09-15 | Discharge: 2015-09-15 | Disposition: A | Discharge status: Home / Self Care | Payer: Medicare Other | Source: Ambulatory Visit | Attending: Oncology | Admitting: Oncology

## 2015-09-15 ENCOUNTER — Other Ambulatory Visit: Payer: Self-pay | Admitting: Oncology

## 2015-09-15 DIAGNOSIS — Z853 Personal history of malignant neoplasm of breast: Secondary | ICD-10-CM | POA: Insufficient documentation

## 2015-09-15 DIAGNOSIS — C50912 Malignant neoplasm of unspecified site of left female breast: Secondary | ICD-10-CM

## 2015-11-12 ENCOUNTER — Inpatient Hospital Stay: Payer: Medicare Other | Attending: Oncology

## 2015-11-12 ENCOUNTER — Inpatient Hospital Stay (HOSPITAL_BASED_OUTPATIENT_CLINIC_OR_DEPARTMENT_OTHER): Payer: Medicare Other | Admitting: Oncology

## 2015-11-12 ENCOUNTER — Encounter: Payer: Self-pay | Admitting: Oncology

## 2015-11-12 VITALS — BP 120/87 | HR 87 | Temp 97.0°F | Resp 18 | Wt 145.5 lb

## 2015-11-12 DIAGNOSIS — Z170 Estrogen receptor positive status [ER+]: Secondary | ICD-10-CM

## 2015-11-12 DIAGNOSIS — Z79811 Long term (current) use of aromatase inhibitors: Secondary | ICD-10-CM | POA: Insufficient documentation

## 2015-11-12 DIAGNOSIS — Z79899 Other long term (current) drug therapy: Secondary | ICD-10-CM

## 2015-11-12 DIAGNOSIS — Z87891 Personal history of nicotine dependence: Secondary | ICD-10-CM | POA: Insufficient documentation

## 2015-11-12 DIAGNOSIS — C50912 Malignant neoplasm of unspecified site of left female breast: Secondary | ICD-10-CM | POA: Diagnosis not present

## 2015-11-12 DIAGNOSIS — M818 Other osteoporosis without current pathological fracture: Secondary | ICD-10-CM | POA: Insufficient documentation

## 2015-11-12 LAB — COMPREHENSIVE METABOLIC PANEL
ALT: 17 U/L (ref 14–54)
AST: 20 U/L (ref 15–41)
Albumin: 3.9 g/dL (ref 3.5–5.0)
Alkaline Phosphatase: 72 U/L (ref 38–126)
Anion gap: 9 (ref 5–15)
BILIRUBIN TOTAL: 0.8 mg/dL (ref 0.3–1.2)
BUN: 14 mg/dL (ref 6–20)
CALCIUM: 9.5 mg/dL (ref 8.9–10.3)
CHLORIDE: 103 mmol/L (ref 101–111)
CO2: 25 mmol/L (ref 22–32)
CREATININE: 0.69 mg/dL (ref 0.44–1.00)
Glucose, Bld: 99 mg/dL (ref 65–99)
Potassium: 4.2 mmol/L (ref 3.5–5.1)
Sodium: 137 mmol/L (ref 135–145)
TOTAL PROTEIN: 7.3 g/dL (ref 6.5–8.1)

## 2015-11-12 LAB — CBC WITH DIFFERENTIAL/PLATELET
Basophils Absolute: 0.1 10*3/uL (ref 0–0.1)
Basophils Relative: 1 %
EOS PCT: 1 %
Eosinophils Absolute: 0.1 10*3/uL (ref 0–0.7)
HEMATOCRIT: 42.9 % (ref 35.0–47.0)
Hemoglobin: 14.6 g/dL (ref 12.0–16.0)
LYMPHS ABS: 2.3 10*3/uL (ref 1.0–3.6)
LYMPHS PCT: 40 %
MCH: 30.7 pg (ref 26.0–34.0)
MCHC: 34.1 g/dL (ref 32.0–36.0)
MCV: 90.1 fL (ref 80.0–100.0)
MONO ABS: 0.5 10*3/uL (ref 0.2–0.9)
Monocytes Relative: 8 %
NEUTROS ABS: 3 10*3/uL (ref 1.4–6.5)
Neutrophils Relative %: 50 %
PLATELETS: 265 10*3/uL (ref 150–440)
RBC: 4.76 MIL/uL (ref 3.80–5.20)
RDW: 13.3 % (ref 11.5–14.5)
WBC: 5.9 10*3/uL (ref 3.6–11.0)

## 2015-11-12 NOTE — Progress Notes (Signed)
[place] @ [facility] [phone]) [phone]  Fax:(336) [place]: 1946-09-14  MR#: [id number]  JTT#:[id number]  Patient Care Team: Jinny Sanders, MD as PCP - General  CHIEF COMPLAINT:  Chief Complaint  Patient presents with  . Breast Cancer    1. 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  Site treated scar boost Tumor dose 4040 cGy in 8 fractions Start D. 02/18/11 Completed 03/02/11 Using 12 MV electrons en face prescribed to 90% isodose.  b. Tamoxifen initiated 04/07/11  2. 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 last Dexa scan 05/15/09. 3.on anastrozole (because of proliferative to endometrial biopsy)    INTERVAL HISTORY:  70 year old lady with stage I carcinoma of breast.  Patient is now on letrozole tolerating treatment very well.  Taking calcium and vitamin D getting regular mammograms done No bony pains to bony fracture reported Patient will finish years of anti-hormonal therapy.  No bony pain no bony fractures.  REVIEW OF SYSTEMS:   GENERAL:  Feels good.  Active.  No fevers, sweats or weight loss. PERFORMANCE STATUS (ECOG):  01 HEENT:  No visual changes, runny nose, sore throat, mouth sores or tenderness. Lungs: No shortness of breath or cough.  No hemoptysis. Cardiac:  No chest pain, palpitations, orthopnea, or PND. GI:  No nausea, vomiting, diarrhea, constipation, melena or hematochezia. GU:  No urgency, frequency, dysuria, or hematuria. Musculoskeletal:  No back pain.  No joint pain.  No muscle tenderness. Extremities:  No pain or swelling. Skin:  No rashes or skin changes. Neuro:  No headache, numbness or weakness, balance or coordination issues. Endocrine:  No diabetes, thyroid issues, hot flashes or night sweats. Psych:  No  mood changes, depression or anxiety. Pain:  No focal pain. Review of systems:  All other systems reviewed and found to be negative. As per HPI. Otherwise, a complete review of systems is negatve.  PAST MEDICAL HISTORY: Past Medical History  Diagnosis Date  . Cancer of left breast ([place]) 05/17/2015    Allergies:  No Known Allergies:   Significant History/PMH:   Thyroid:    Psorasis:    Cancer, Breast  FAMILY HISTORY There is no significant family history of breast cancer, ovarian cancer, colon cancer ADVANCED DIRECTIVES:  Patient does not have any living will or healthcare power of attorney.  Information was given .  Available resources had been discussed.  We will follow-up on subsequent appointments regarding this issue HEALTH MAINTENANCE: Social History  Substance Use Topics  . Smoking status: Former Research scientist (life sciences)  . Smokeless tobacco: None  . Alcohol Use: None      No Known Allergies  Current Outpatient Prescriptions  Medication Sig Dispense Refill  . letrozole (FEMARA) 2.5 MG tablet Take 1 tablet (2.5 mg total) by mouth daily. 30 tablet 6  . SYNTHROID 88 MCG tablet   0  . Vitamin D, Ergocalciferol, (DRISDOL) 50000 UNITS CAPS capsule   0   No current facility-administered medications for this visit.    OBJECTIVE:  Filed Vitals:   11/12/15 1559  BP: 120/87  Pulse: 87  Temp: 97 F (36.1 C)  Resp: 18     Body mass index is 25.78 kg/(m^2).    ECOG FS:1 - Symptomatic but completely ambulatory  PHYSICAL EXAM: GENERAL:  Well developed, well nourished, sitting comfortably in  the exam room in no acute distress. MENTAL STATUS:  Alert and oriented to person, place and time. . ENT:  Oropharynx clear without lesion.  Tongue normal. Mucous membranes moist.  RESPIRATORY:  Clear to auscultation without rales, wheezes or rhonchi. CARDIOVASCULAR:  Regular rate and rhythm without murmur, rub or gallop. BREAST:  Right breast without masses, skin changes or nipple discharge.   Left breast without masses, skin changes or nipple discharge. ABDOMEN:  Soft, non-tender, with active bowel sounds, and no hepatosplenomegaly.  No masses. BACK:  No CVA tenderness.  No tenderness on percussion of the back or rib cage. SKIN:  No rashes, ulcers or lesions. EXTREMITIES: No edema, no skin discoloration or tenderness.  No palpable cords. LYMPH NODES: No palpable cervical, supraclavicular, axillary or inguinal adenopathy  NEUROLOGICAL: Unremarkable. PSYCH:  Appropriate.   LAB RESULTS:  CBC Latest Ref Rng 11/12/2015 05/12/2015  WBC 3.6 - 11.0 K/uL 5.9 7.4  Hemoglobin 12.0 - 16.0 g/dL 14.6 15.0  Hematocrit 35.0 - 47.0 % 42.9 44.1  Platelets 150 - 440 K/uL 265 287    Appointment on 11/12/2015  Component Date Value Ref Range Status  . WBC 11/12/2015 5.9  3.6 - 11.0 K/uL Final  . RBC 11/12/2015 4.76  3.80 - 5.20 MIL/uL Final  . Hemoglobin 11/12/2015 14.6  12.0 - 16.0 g/dL Final  . HCT 11/12/2015 42.9  35.0 - 47.0 % Final  . MCV 11/12/2015 90.1  80.0 - 100.0 fL Final  . MCH 11/12/2015 30.7  26.0 - 34.0 pg Final  . MCHC 11/12/2015 34.1  32.0 - 36.0 g/dL Final  . RDW 11/12/2015 13.3  11.5 - 14.5 % Final  . Platelets 11/12/2015 265  150 - 440 K/uL Final  . Neutrophils Relative % 11/12/2015 50   Final  . Neutro Abs 11/12/2015 3.0  1.4 - 6.5 K/uL Final  . Lymphocytes Relative 11/12/2015 40   Final  . Lymphs Abs 11/12/2015 2.3  1.0 - 3.6 K/uL Final  . Monocytes Relative 11/12/2015 8   Final  . Monocytes Absolute 11/12/2015 0.5  0.2 - 0.9 K/uL Final  . Eosinophils Relative 11/12/2015 1   Final  . Eosinophils Absolute 11/12/2015 0.1  0 - 0.7 K/uL Final  . Basophils Relative 11/12/2015 1   Final  . Basophils Absolute 11/12/2015 0.1  0 - 0.1 K/uL Final  . Sodium 11/12/2015 137  135 - 145 mmol/L Final  . Potassium 11/12/2015 4.2  3.5 - 5.1 mmol/L Final  . Chloride 11/12/2015 103  101 - 111 mmol/L Final  . CO2 11/12/2015 25  22 - 32 mmol/L Final  . Glucose, Bld 11/12/2015 99  65 - 99  mg/dL Final  . BUN 11/12/2015 14  6 - 20 mg/dL Final  . Creatinine, Ser 11/12/2015 0.69  0.44 - 1.00 mg/dL Final  . Calcium 11/12/2015 9.5  8.9 - 10.3 mg/dL Final  . Total Protein 11/12/2015 7.3  6.5 - 8.1 g/dL Final  . Albumin 11/12/2015 3.9  3.5 - 5.0 g/dL Final  . AST 11/12/2015 20  15 - 41 U/L Final  . ALT 11/12/2015 17  14 - 54 U/L Final  . Alkaline Phosphatase 11/12/2015 72  38 - 126 U/L Final  . Total Bilirubin 11/12/2015 0.8  0.3 - 1.2 mg/dL Final  . GFR calc non Af Amer 11/12/2015 >60  >60 mL/min Final  . GFR calc Af Amer 11/12/2015 >60  >60 mL/min Final   Comment: (NOTE) The eGFR has been calculated using the CKD EPI equation.  This calculation has not been validated in all clinical situations. eGFR's persistently <60 mL/min signify possible Chronic Kidney Disease.   . Anion gap 11/12/2015 9  5 - 15 Final    IMPRESSION: No findings worrisome for recurrent tumor or developing malignancy.  RECOMMENDATION: Bilateral diagnostic mammography in 1 year.  I have discussed the findings and recommendations with the patient. Results were also provided in writing at the conclusion of the visit. If applicable, a reminder letter will be sent to the patient regarding the next appointment.  BI-RADS CATEGORY 1: Negative.   Electronically Signed  By: Altamese Cabal M.D.  On: 09/15/2015 08:50    ASSESSMENT: Carcinoma of breast stage I disease estrogen and progesterone receptor positive.  We will send patient's tissue for breast cancer index to decide whether further treatment would be useful  MEDICAL DECISION MAKING:  Patient has finished 5 years of letrozole and anti-hormonal therapy was advised to stop Breast cancer index is being arranged.  Mammogram in December of 2016 reported to be negative. Patient expressed understanding and was in agreement with this plan. She also understands that She can call clinic at any time with any questions, concerns, or complaints.     No matching staging information was found for the patient.  Forest Gleason, MD   11/12/2015 4:24 PM

## 2015-11-15 ENCOUNTER — Encounter: Payer: Self-pay | Admitting: Oncology

## 2015-11-26 ENCOUNTER — Encounter: Payer: Self-pay | Admitting: Oncology

## 2015-12-02 ENCOUNTER — Telehealth: Payer: Self-pay | Admitting: *Deleted

## 2015-12-02 NOTE — Telephone Encounter (Signed)
Called patient to inform her that she would benefit from extended therapy with letrozole.  She may stop letrozole when she has reached 5 years which will be in July 2017.  Patient verbalized understanding and very grateful for the call.

## 2015-12-02 NOTE — Telephone Encounter (Signed)
Results for breast cancer index show low likelihood of benefit from extended endocrine therapy with letrozole. Patient my stop letrozole when completes 5 years which would be in July 2017.

## 2016-01-12 ENCOUNTER — Other Ambulatory Visit: Payer: Self-pay | Admitting: Oncology

## 2016-05-12 ENCOUNTER — Other Ambulatory Visit: Payer: Medicare Other

## 2016-05-12 ENCOUNTER — Ambulatory Visit: Payer: Medicare Other | Admitting: Oncology

## 2016-05-30 NOTE — Progress Notes (Signed)
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 She completed 5 years of letrozole in July 2017. She currently feels well and is asymptomatic. She has no neurologic complaints. She denies any recent fevers or illnesses. She has a good appetite and denies weight loss. She has no chest pain or shortness of breath. She denies any nausea, vomiting, constipation, or diarrhea. She has no urinary complaints. Patient feels at her baseline and offers no specific complaints today.  REVIEW OF SYSTEMS:   Review of Systems  Constitutional: Negative.  Negative for fever, malaise/fatigue and weight loss.  Respiratory: Negative.  Negative for cough and shortness of breath.   Cardiovascular: Negative.  Negative for chest pain.  Gastrointestinal: Negative.   Genitourinary: Negative.   Musculoskeletal: Negative.  Negative for joint pain.  Neurological: Negative.  Negative for weakness.  Psychiatric/Behavioral: Negative.  The patient is not nervous/anxious.     As per HPI. Otherwise, a complete review of systems is negative.  PAST MEDICAL HISTORY: Past Medical History:  Diagnosis Date  . Cancer of left breast ([place]) 05/17/2015    PAST SURGICAL HISTORY: Past Surgical History:  Procedure Laterality Date  . BREAST BIOPSY Left 2011   breast ca radation    FAMILY HISTORY: Family History  Problem Relation Age of Onset  . Breast cancer Sister 55       ADVANCED DIRECTIVES (Y/N):  N   HEALTH MAINTENANCE: Social History  Substance Use Topics  . Smoking status: Former Research scientist (life sciences)  . Smokeless tobacco: Not on file  .  Alcohol use Not on file     Colonoscopy:  PAP:  Bone density:  Lipid panel:  No Known Allergies  Current Outpatient Prescriptions  Medication Sig Dispense Refill  . SYNTHROID 88 MCG tablet Take 88 mcg by mouth daily before breakfast.   0  . Vitamin D, Ergocalciferol, (DRISDOL) 50000 UNITS CAPS capsule   0   No current facility-administered medications for this visit.     OBJECTIVE: Vitals:   06/01/16 1107  BP: 103/78  Pulse: 80  Resp: 18  Temp: 97.2 F (36.2 C)     Body mass index is 26.01 kg/m.    ECOG FS:0 - Asymptomatic  General: Well-developed, well-nourished, no acute distress. Eyes: Pink conjunctiva, anicteric sclera. Breasts: Bilateral breasts and axilla without lumps or masses. Lungs: Clear to auscultation bilaterally. Heart: Regular rate and rhythm. No rubs, murmurs, or gallops. Abdomen: Soft, nontender, nondistended. No organomegaly noted, normoactive bowel sounds. Musculoskeletal: No edema, cyanosis, or clubbing. Neuro: Alert, answering all questions appropriately. Cranial nerves grossly intact. Skin: No rashes or petechiae noted. Psych: Normal affect  LAB RESULTS:  Lab Results  Component Value Date   NA 135 06/01/2016   K 4.0 06/01/2016   CL 104 06/01/2016   CO2 26 06/01/2016   GLUCOSE 93 06/01/2016   BUN 12 06/01/2016   CREATININE 0.68 06/01/2016   CALCIUM 9.3 06/01/2016   PROT 7.6 06/01/2016   ALBUMIN 4.3 06/01/2016   AST 18 06/01/2016   ALT 16 06/01/2016   ALKPHOS 72 06/01/2016   BILITOT 0.9 06/01/2016   GFRNONAA >60 06/01/2016   GFRAA >60 06/01/2016  Lab Results  Component Value Date   WBC 6.3 06/01/2016   NEUTROABS 3.7 06/01/2016   HGB 14.9 06/01/2016   HCT 43.2 06/01/2016   MCV 90.2 06/01/2016   PLT 268 06/01/2016     STUDIES: No results found.  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 Pathologic stage Ia ER/PR positive adenocarcinoma of  the upper outer quadrant of the left breast:  Given patient's low stage of disease, she did not require adjuvant chemotherapy. She completed adjuvant XRT in May 2012. She subsequently took 5 years of letrozole completing in July 2017. Her most recent mammogram in December 2016 was reported as BI-RADS 1. Repeat in December 2017. Patient can now be switched to yearly visits. Return to clinic in 1 year for routine evaluation.  Patient expressed understanding and was in agreement with this plan. She also understands that She can call clinic at any time with any questions, concerns, or complaints.   Primary cancer of upper outer quadrant of left female breast [facility])   Staging form: Breast, AJCC 7th Edition   - Clinical stage from 06/06/2016: Stage IA (T1c, N0, M0) - Signed by Lloyd Huger, MD on 06/06/2016  Lloyd Huger, MD   06/06/2016 8:11 AM

## 2016-06-01 ENCOUNTER — Encounter (INDEPENDENT_AMBULATORY_CARE_PROVIDER_SITE_OTHER): Payer: Self-pay

## 2016-06-01 ENCOUNTER — Inpatient Hospital Stay: Payer: Medicare Other | Attending: Oncology | Admitting: Oncology

## 2016-06-01 ENCOUNTER — Inpatient Hospital Stay: Payer: Medicare Other

## 2016-06-01 VITALS — BP 103/78 | HR 80 | Temp 97.2°F | Resp 18 | Wt 146.8 lb

## 2016-06-01 DIAGNOSIS — C50412 Malignant neoplasm of upper-outer quadrant of left female breast: Secondary | ICD-10-CM

## 2016-06-01 DIAGNOSIS — Z803 Family history of malignant neoplasm of breast: Secondary | ICD-10-CM | POA: Insufficient documentation

## 2016-06-01 DIAGNOSIS — Z79899 Other long term (current) drug therapy: Secondary | ICD-10-CM | POA: Diagnosis not present

## 2016-06-01 DIAGNOSIS — Z853 Personal history of malignant neoplasm of breast: Secondary | ICD-10-CM | POA: Insufficient documentation

## 2016-06-01 DIAGNOSIS — Z87891 Personal history of nicotine dependence: Secondary | ICD-10-CM | POA: Diagnosis not present

## 2016-06-01 DIAGNOSIS — Z923 Personal history of irradiation: Secondary | ICD-10-CM | POA: Insufficient documentation

## 2016-06-01 DIAGNOSIS — C50912 Malignant neoplasm of unspecified site of left female breast: Secondary | ICD-10-CM

## 2016-06-01 LAB — CBC WITH DIFFERENTIAL/PLATELET
Basophils Absolute: 0 10*3/uL (ref 0–0.1)
Basophils Relative: 0 %
Eosinophils Absolute: 0.1 10*3/uL (ref 0–0.7)
Eosinophils Relative: 1 %
HEMATOCRIT: 43.2 % (ref 35.0–47.0)
Hemoglobin: 14.9 g/dL (ref 12.0–16.0)
LYMPHS ABS: 2.1 10*3/uL (ref 1.0–3.6)
Lymphocytes Relative: 33 %
MCH: 31.1 pg (ref 26.0–34.0)
MCHC: 34.6 g/dL (ref 32.0–36.0)
MCV: 90.2 fL (ref 80.0–100.0)
MONOS PCT: 7 %
Monocytes Absolute: 0.4 10*3/uL (ref 0.2–0.9)
NEUTROS ABS: 3.7 10*3/uL (ref 1.4–6.5)
NEUTROS PCT: 59 %
PLATELETS: 268 10*3/uL (ref 150–440)
RBC: 4.79 MIL/uL (ref 3.80–5.20)
RDW: 13.7 % (ref 11.5–14.5)
WBC: 6.3 10*3/uL (ref 3.6–11.0)

## 2016-06-01 LAB — COMPREHENSIVE METABOLIC PANEL
ALBUMIN: 4.3 g/dL (ref 3.5–5.0)
ALT: 16 U/L (ref 14–54)
ANION GAP: 5 (ref 5–15)
AST: 18 U/L (ref 15–41)
Alkaline Phosphatase: 72 U/L (ref 38–126)
BILIRUBIN TOTAL: 0.9 mg/dL (ref 0.3–1.2)
BUN: 12 mg/dL (ref 6–20)
CHLORIDE: 104 mmol/L (ref 101–111)
CO2: 26 mmol/L (ref 22–32)
Calcium: 9.3 mg/dL (ref 8.9–10.3)
Creatinine, Ser: 0.68 mg/dL (ref 0.44–1.00)
GFR calc Af Amer: >60 mL/min (ref >60)
Glucose, Bld: 93 mg/dL (ref 65–99)
POTASSIUM: 4 mmol/L (ref 3.5–5.1)
Sodium: 135 mmol/L (ref 135–145)
TOTAL PROTEIN: 7.6 g/dL (ref 6.5–8.1)

## 2016-06-01 NOTE — Progress Notes (Signed)
States is feeling well. Offers no complaints. Stopped letrozole in July per Dr. Oliva Bustard.

## 2016-06-07 NOTE — Progress Notes (Signed)
Not my patient.. Never seen before. Change pPCP.

## 2016-06-07 NOTE — Progress Notes (Signed)
Correction.. Have not seen in 8-10 years? Call pt if she needs PCP.Marland Kitchen needs to be reestablished as new pt here.

## 2016-06-30 ENCOUNTER — Other Ambulatory Visit: Payer: Self-pay | Admitting: Endocrinology

## 2016-06-30 DIAGNOSIS — I4510 Unspecified right bundle-branch block: Secondary | ICD-10-CM

## 2016-07-08 ENCOUNTER — Ambulatory Visit
Admission: RE | Admit: 2016-07-08 | Discharge: 2016-07-08 | Disposition: A | Discharge status: Home / Self Care | Payer: Medicare Other | Source: Ambulatory Visit | Attending: Endocrinology | Admitting: Endocrinology

## 2016-07-08 DIAGNOSIS — I4510 Unspecified right bundle-branch block: Secondary | ICD-10-CM

## 2016-07-08 DIAGNOSIS — Z029 Encounter for administrative examinations, unspecified: Secondary | ICD-10-CM | POA: Diagnosis present

## 2016-07-08 NOTE — Progress Notes (Signed)
*  PRELIMINARY RESULTS* Echocardiogram 2D Echocardiogram has been performed.  [name] 07/08/2016, 11:31 AM

## 2016-09-15 ENCOUNTER — Ambulatory Visit
Admission: RE | Admit: 2016-09-15 | Discharge: 2016-09-15 | Disposition: A | Discharge status: Home / Self Care | Payer: Medicare Other | Source: Ambulatory Visit | Attending: Oncology | Admitting: Oncology

## 2016-09-15 DIAGNOSIS — C50412 Malignant neoplasm of upper-outer quadrant of left female breast: Secondary | ICD-10-CM

## 2016-09-15 HISTORY — DX: Malignant neoplasm of unspecified site of unspecified female breast: C50.919

## 2016-09-15 HISTORY — DX: Personal history of irradiation: Z92.3

## 2016-12-01 ENCOUNTER — Ambulatory Visit: Payer: Medicare Other | Admitting: Oncology

## 2017-05-30 NOTE — Progress Notes (Signed)
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 She completed 5 years of letrozole in July 2017. She currently feels well and is asymptomatic. She has no neurologic complaints. She denies any recent fevers or illnesses. She has a good appetite and denies weight loss. She has no chest pain or shortness of breath. She denies any nausea, vomiting, constipation, or diarrhea. She has no urinary complaints. Patient feels at her baseline and offers no specific complaints today.  REVIEW OF SYSTEMS:   Review of Systems  Constitutional: Negative.  Negative for fever, malaise/fatigue and weight loss.  Respiratory: Negative.  Negative for cough and shortness of breath.   Cardiovascular: Negative.  Negative for chest pain and leg swelling.  Gastrointestinal: Negative.   Genitourinary: Negative.   Musculoskeletal: Negative.  Negative for joint pain.  Skin: Negative.  Negative for rash.  Neurological: Negative.  Negative for sensory change and weakness.  Psychiatric/Behavioral: Negative.  The patient is not nervous/anxious.     As per HPI. Otherwise, a complete review of systems is negative.  PAST MEDICAL HISTORY: Past Medical History:  Diagnosis Date  . Breast cancer ([place]) 2011   LT LUMPECTOMY  . Personal history of radiation therapy 2011   BREAST CA    PAST SURGICAL HISTORY: Past Surgical History:  Procedure Laterality Date  . BREAST BIOPSY Left 2011   breast ca radation    FAMILY HISTORY: Family History  Problem Relation Age of Onset  . Breast cancer Sister 40  . Breast cancer  Daughter 53       ADVANCED DIRECTIVES (Y/N):  N   HEALTH MAINTENANCE: Social History  Substance Use Topics  . Smoking status: Former Research scientist (life sciences)  . Smokeless tobacco: Never Used  . Alcohol use No     Colonoscopy:  PAP:  Bone density:  Lipid panel:  No Known Allergies  Current Outpatient Prescriptions  Medication Sig Dispense Refill  . SYNTHROID 88 MCG tablet Take 88 mcg by mouth daily before breakfast.   0  . Vitamin D, Ergocalciferol, (DRISDOL) 50000 UNITS CAPS capsule   0   No current facility-administered medications for this visit.     OBJECTIVE: Vitals:   06/01/17 1046  BP: 132/76  Pulse: 79  Temp: (!) 96.9 F (36.1 C)     There is no height or weight on file to calculate BMI.    ECOG FS:0 - Asymptomatic  General: Well-developed, well-nourished, no acute distress. Eyes: Pink conjunctiva, anicteric sclera. Breasts: Bilateral breasts and axilla without lumps or masses. Lungs: Clear to auscultation bilaterally. Heart: Regular rate and rhythm. No rubs, murmurs, or gallops. Abdomen: Soft, nontender, nondistended. No organomegaly noted, normoactive bowel sounds. Musculoskeletal: No edema, cyanosis, or clubbing. Neuro: Alert, answering all questions appropriately. Cranial nerves grossly intact. Skin: No rashes or petechiae noted. Psych: Normal affect  LAB RESULTS:  Lab Results  Component Value Date   NA 135 06/01/2016   K 4.0 06/01/2016   CL 104 06/01/2016   CO2 26 06/01/2016   GLUCOSE 93 06/01/2016   BUN 12 06/01/2016   CREATININE 0.68 06/01/2016   CALCIUM 9.3 06/01/2016   PROT 7.6 06/01/2016   ALBUMIN 4.3  06/01/2016   AST 18 06/01/2016   ALT 16 06/01/2016   ALKPHOS 72 06/01/2016   BILITOT 0.9 06/01/2016   GFRNONAA >60 06/01/2016   GFRAA >60 06/01/2016    Lab Results  Component Value Date   WBC 6.3 06/01/2016   NEUTROABS 3.7 06/01/2016   HGB 14.9 06/01/2016   HCT 43.2 06/01/2016   MCV 90.2 06/01/2016   PLT 268 06/01/2016     STUDIES: No  results found.  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 She completed adjuvant XRT in May 2012. She subsequently took 5 years of letrozole completing in July 2017. Her most recent mammogram on September 15, 2016 was reported as BI-RADS 2. Repeat in December 2018. Return to clinic in 1 year for routine evaluation.   Approximately 20 minutes was spent in discussion of which greater than 50% was consultation.  Patient expressed understanding and was in agreement with this plan. She also understands that She can call clinic at any time with any questions, concerns, or complaints.   Cancer Staging Primary cancer of upper outer quadrant of left female breast [facility]) Staging form: Breast, AJCC 7th Edition - Clinical stage from 06/06/2016: Stage IA (T1c, N0, M0) - Signed by Lloyd Huger, MD on 06/06/2016   Lloyd Huger, MD   06/02/2017 8:45 AM

## 2017-06-01 ENCOUNTER — Encounter: Payer: Self-pay | Admitting: Oncology

## 2017-06-01 ENCOUNTER — Inpatient Hospital Stay: Payer: Medicare Other | Attending: Oncology | Admitting: Oncology

## 2017-06-01 VITALS — BP 132/76 | HR 79 | Temp 96.9°F | Wt 146.9 lb

## 2017-06-01 DIAGNOSIS — Z79899 Other long term (current) drug therapy: Secondary | ICD-10-CM | POA: Diagnosis not present

## 2017-06-01 DIAGNOSIS — C50412 Malignant neoplasm of upper-outer quadrant of left female breast: Secondary | ICD-10-CM

## 2017-06-01 DIAGNOSIS — Z853 Personal history of malignant neoplasm of breast: Secondary | ICD-10-CM | POA: Insufficient documentation

## 2017-06-01 DIAGNOSIS — Z923 Personal history of irradiation: Secondary | ICD-10-CM | POA: Diagnosis not present

## 2017-06-01 DIAGNOSIS — Z87891 Personal history of nicotine dependence: Secondary | ICD-10-CM | POA: Insufficient documentation

## 2017-09-18 ENCOUNTER — Ambulatory Visit
Admission: RE | Admit: 2017-09-18 | Discharge: 2017-09-18 | Disposition: A | Discharge status: Home / Self Care | Payer: Medicare Other | Source: Ambulatory Visit | Attending: Oncology | Admitting: Oncology

## 2017-09-18 ENCOUNTER — Encounter (HOSPITAL_COMMUNITY): Payer: Self-pay

## 2017-09-18 DIAGNOSIS — C50412 Malignant neoplasm of upper-outer quadrant of left female breast: Secondary | ICD-10-CM

## 2017-10-01 IMAGING — MG MM DIAG BREAST TOMO BILATERAL
8 of 17 series · 8 of 37 positions shown · non-contrast
Comparison: Previous exam(s).

CLINICAL DATA: History of malignant lumpectomy of the left breast
in 6944. Annual re-evaluation.

EXAM:
DIGITAL DIAGNOSTIC BILATERAL MAMMOGRAM WITH 3D TOMOSYNTHESIS AND CAD

[L TAN (1 of 2)]
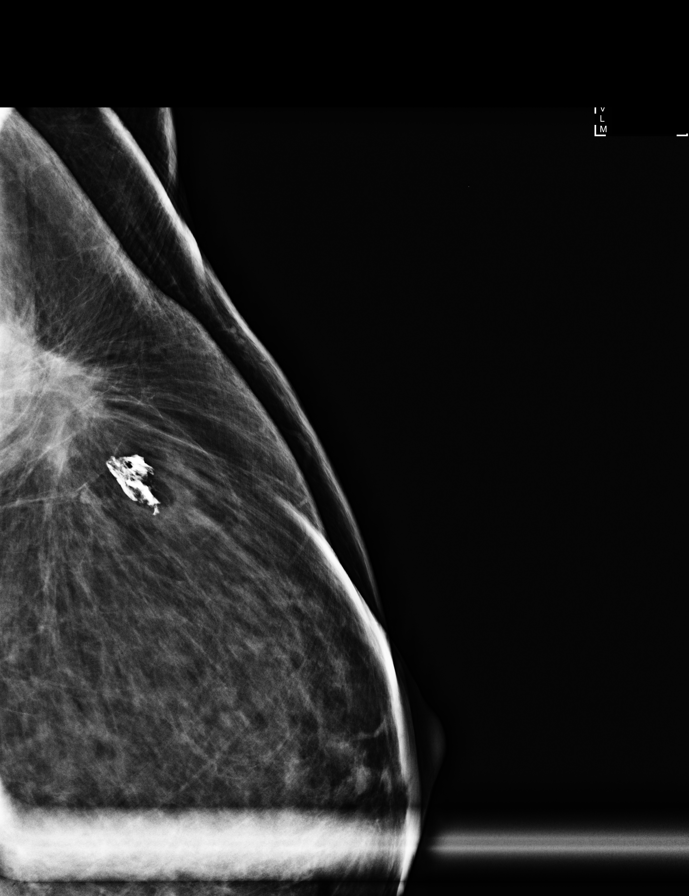

[L TAN (2 of 2)]
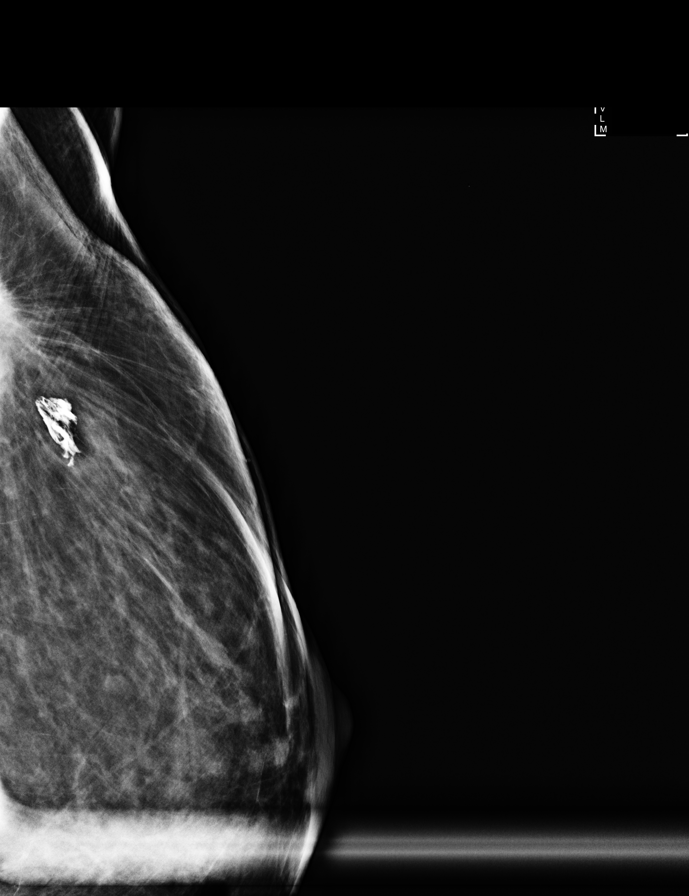

[L MLO]
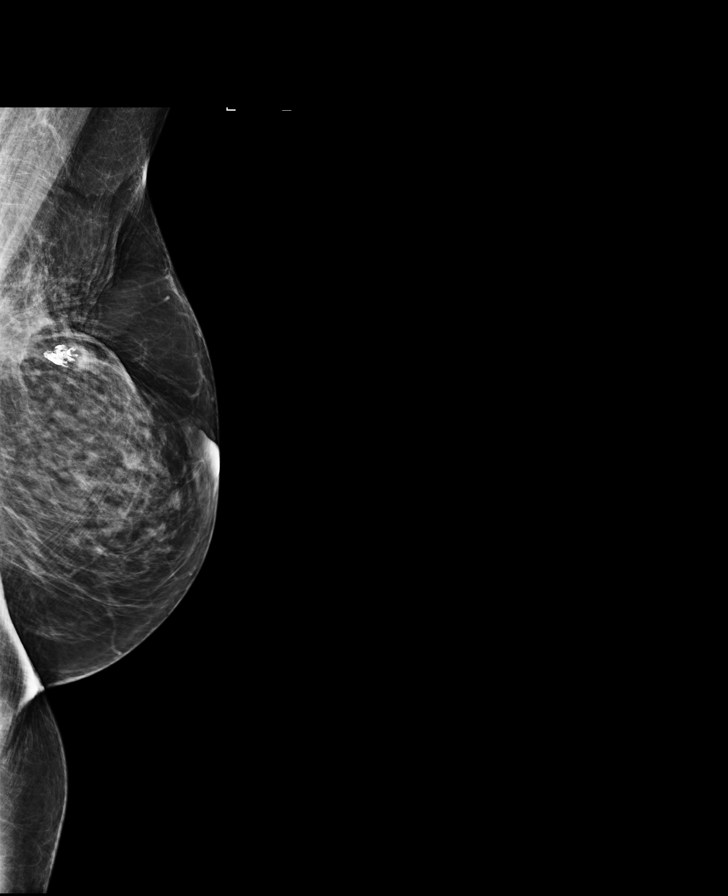

[L XCCL synth-2D]
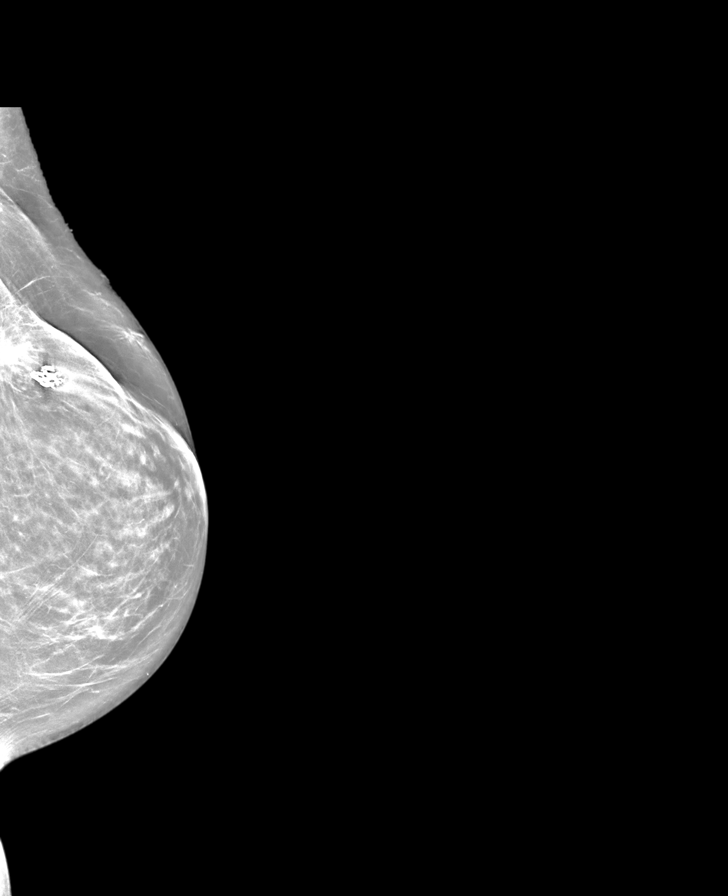

[L MLO synth-2D]
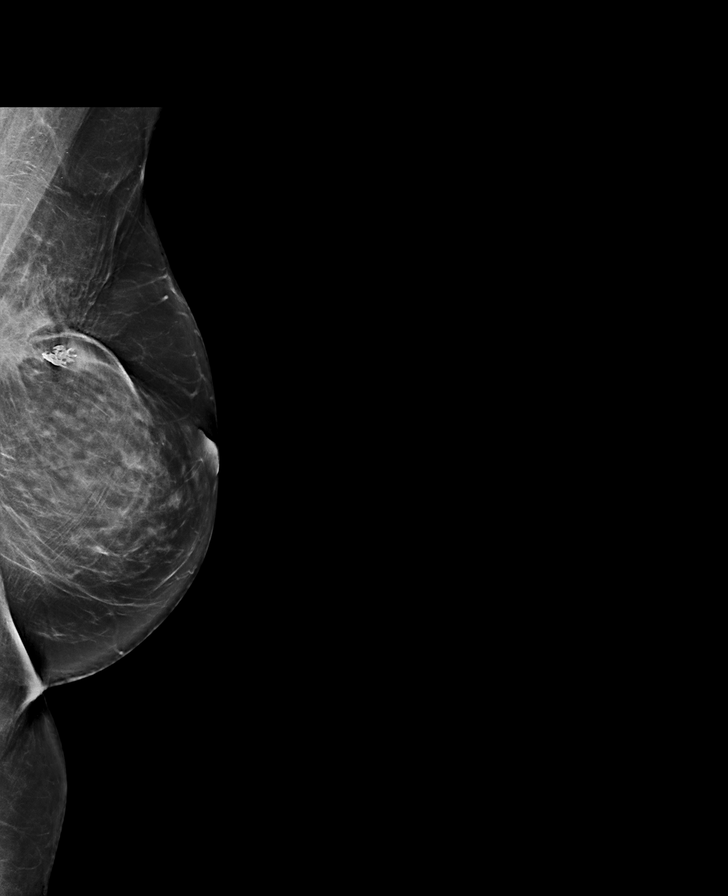

[L CC]
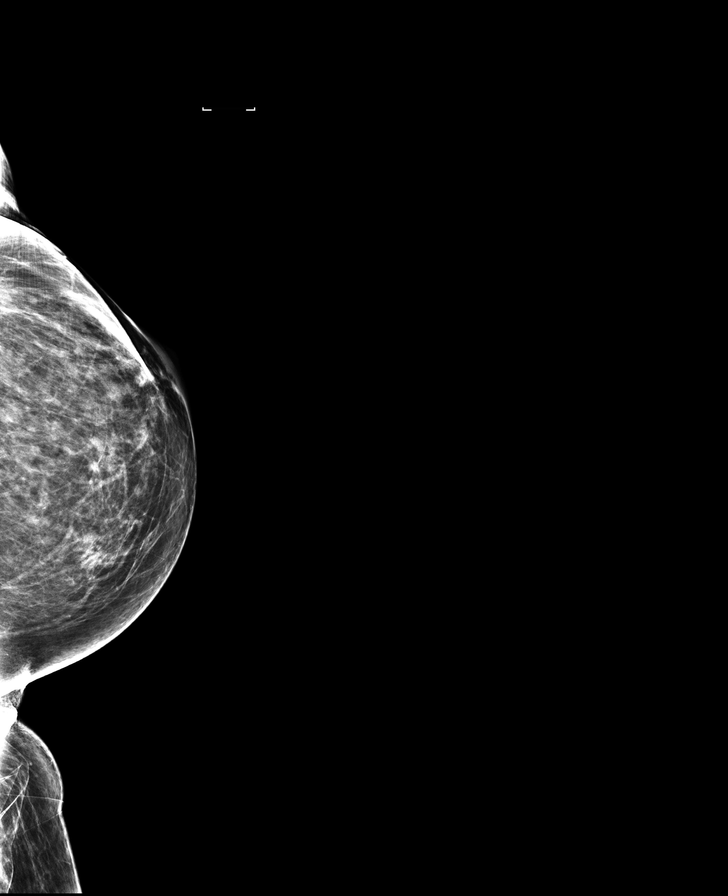

[L CC synth-2D]
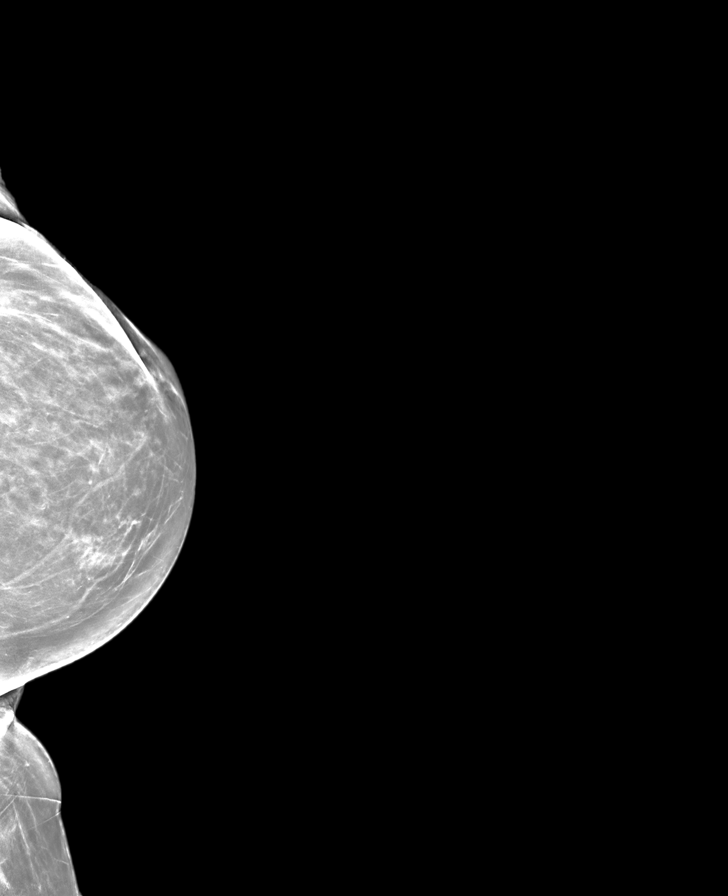

[L XCCL]
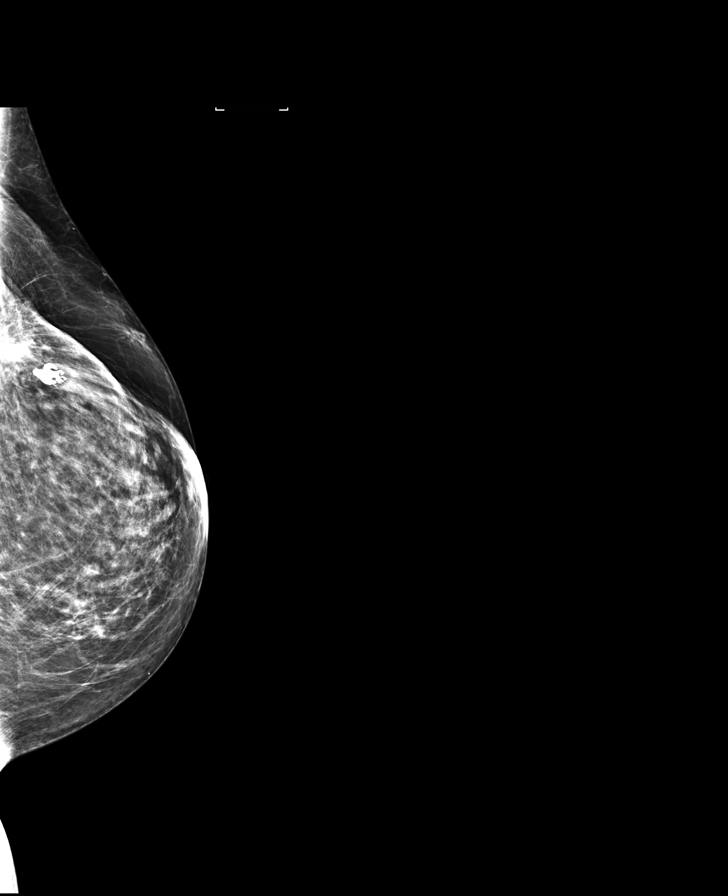

[8 of 37 positions shown; findings below may reference images not displayed]

ACR Breast Density Category b: There are scattered areas of
fibroglandular density.
FINDINGS: There are stable scarring changes located within the left breast
related to the patient's lumpectomy. There is no specific evidence
for recurrent tumor or developing malignancy within either breast.

Mammographic images were processed with CAD.
IMPRESSION: No findings worrisome for recurrent tumor or developing malignancy.

RECOMMENDATION:
Bilateral diagnostic mammography in 1 year.

I have discussed the findings and recommendations with the patient.
Results were also provided in writing at the conclusion of the
visit. If applicable, a reminder letter will be sent to the patient
regarding the next appointment.

BI-RADS CATEGORY  1: Negative.

## 2018-05-27 NOTE — Progress Notes (Signed)
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  She continues to feel well and remains asymptomatic. She has no neurologic complaints. She denies any recent fevers or illnesses. She has a good appetite and denies weight loss. She has no chest pain or shortness of breath. She denies any nausea, vomiting, constipation, or diarrhea. She has no urinary complaints.  Patient feels at her baseline offers no specific complaints today.  REVIEW OF SYSTEMS:   Review of Systems  Constitutional: Negative.  Negative for fever, malaise/fatigue and weight loss.  Respiratory: Negative.  Negative for cough and shortness of breath.   Cardiovascular: Negative.  Negative for chest pain and leg swelling.  Gastrointestinal: Negative.  Negative for abdominal pain.  Genitourinary: Negative.  Negative for dysuria.  Musculoskeletal: Negative.  Negative for joint pain.  Skin: Negative.  Negative for rash.  Neurological: Negative.  Negative for sensory change and weakness.  Psychiatric/Behavioral: Negative.  The patient is not nervous/anxious.     As per HPI. Otherwise, a complete review of systems is negative.  PAST MEDICAL HISTORY: Past Medical History:  Diagnosis Date  . Breast cancer ([place]) 2011   LT LUMPECTOMY  . Personal history of radiation therapy 2011   BREAST CA    PAST SURGICAL HISTORY: Past Surgical History:  Procedure Laterality Date  . BREAST BIOPSY Left 2011   breast ca radation    FAMILY HISTORY: Family History  Problem Relation Age of Onset  . Breast cancer Sister 34  . Breast cancer Daughter 7       ADVANCED  DIRECTIVES (Y/N):  N   HEALTH MAINTENANCE: Social History   Tobacco Use  . Smoking status: Former Research scientist (life sciences)  . Smokeless tobacco: Never Used  Substance Use Topics  . Alcohol use: No  . Drug use: No     Colonoscopy:  PAP:  Bone density:  Lipid panel:  No Known Allergies  Current Outpatient Medications  Medication Sig Dispense Refill  . alendronate (FOSAMAX) 70 MG tablet Take 1 tablet by mouth once a week.    Marland Kitchen aspirin EC 81 MG tablet Take 1 tablet by mouth once a week.    Marland Kitchen SYNTHROID 100 MCG tablet Take 1 tablet by mouth daily.    Marland Kitchen SYNTHROID 88 MCG tablet Take 88 mcg by mouth daily before breakfast.   0  . Vitamin D, Ergocalciferol, (DRISDOL) 50000 UNITS CAPS capsule   0   No current facility-administered medications for this visit.     OBJECTIVE: Vitals:   05/31/18 1045 05/31/18 1051  BP:  107/74  Pulse:  84  Resp: 12   Temp:  97.6 F (36.4 C)     Body mass index is 25.47 kg/m.    ECOG FS:0 - Asymptomatic  General: Well-developed, well-nourished, no acute distress. Eyes: Pink conjunctiva, anicteric sclera. HEENT: Normocephalic, moist mucous membranes. Breast: Bilateral breast and axilla without lumps or masses. Lungs: Clear to auscultation bilaterally. Heart: Regular rate and rhythm. No rubs, murmurs, or gallops. Abdomen: Soft, nontender, nondistended. No organomegaly noted, normoactive bowel sounds. Musculoskeletal: No edema, cyanosis, or clubbing. Neuro: Alert, answering all questions appropriately. Cranial nerves grossly intact. Skin: No rashes or petechiae noted. Psych: Normal affect.  LAB RESULTS:  Lab Results  Component Value Date   NA 135 06/01/2016   K 4.0 06/01/2016   CL 104 06/01/2016   CO2 26 06/01/2016   GLUCOSE 93 06/01/2016   BUN 12 06/01/2016   CREATININE 0.68 06/01/2016   CALCIUM 9.3 06/01/2016   PROT 7.6 06/01/2016   ALBUMIN 4.3 06/01/2016   AST 18 06/01/2016   ALT 16 06/01/2016   ALKPHOS 72 06/01/2016   BILITOT 0.9 06/01/2016    GFRNONAA >60 06/01/2016   GFRAA >60 06/01/2016    Lab Results  Component Value Date   WBC 6.3 06/01/2016   NEUTROABS 3.7 06/01/2016   HGB 14.9 06/01/2016   HCT 43.2 06/01/2016   MCV 90.2 06/01/2016   PLT 268 06/01/2016     STUDIES: No results found.  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 She completed adjuvant XRT in May 2012. She subsequently took 5 years of letrozole completing in July 2017.  Patient's most recent mammogram on September 18, 2017 was reported as BI-RADS 2.  Repeat in December 2019.  Return to clinic in 1 year for routine evaluation.    I spent a total of 20 minutes face-to-face with the patient of which greater than 50% of the visit was spent in counseling and coordination of care as detailed above.   Patient expressed understanding and was in agreement with this plan. She also understands that She can call clinic at any time with any questions, concerns, or complaints.   Cancer Staging Primary cancer of upper outer quadrant of left female breast [facility]) Staging form: Breast, AJCC 7th Edition - Clinical stage from 06/06/2016: Stage IA (T1c, N0, M0) - Signed by Lloyd Huger, MD on 06/06/2016   Lloyd Huger, MD   06/04/2018 7:41 AM

## 2018-05-31 ENCOUNTER — Inpatient Hospital Stay: Payer: Medicare Other | Attending: Oncology | Admitting: Oncology

## 2018-05-31 ENCOUNTER — Other Ambulatory Visit: Payer: Self-pay

## 2018-05-31 ENCOUNTER — Encounter: Payer: Self-pay | Admitting: Oncology

## 2018-05-31 ENCOUNTER — Other Ambulatory Visit: Payer: Self-pay | Admitting: Oncology

## 2018-05-31 VITALS — BP 107/74 | HR 84 | Temp 97.6°F | Resp 12 | Ht 63.0 in | Wt 143.8 lb

## 2018-05-31 DIAGNOSIS — Z79899 Other long term (current) drug therapy: Secondary | ICD-10-CM | POA: Insufficient documentation

## 2018-05-31 DIAGNOSIS — Z87891 Personal history of nicotine dependence: Secondary | ICD-10-CM

## 2018-05-31 DIAGNOSIS — Z803 Family history of malignant neoplasm of breast: Secondary | ICD-10-CM | POA: Insufficient documentation

## 2018-05-31 DIAGNOSIS — Z853 Personal history of malignant neoplasm of breast: Secondary | ICD-10-CM | POA: Diagnosis not present

## 2018-05-31 DIAGNOSIS — Z1231 Encounter for screening mammogram for malignant neoplasm of breast: Secondary | ICD-10-CM

## 2018-05-31 DIAGNOSIS — Z923 Personal history of irradiation: Secondary | ICD-10-CM | POA: Diagnosis not present

## 2018-05-31 DIAGNOSIS — Z7982 Long term (current) use of aspirin: Secondary | ICD-10-CM | POA: Insufficient documentation

## 2018-05-31 DIAGNOSIS — C50412 Malignant neoplasm of upper-outer quadrant of left female breast: Secondary | ICD-10-CM

## 2018-05-31 NOTE — Progress Notes (Signed)
Patient here for follow up. She had breast exam this past December.

## 2018-09-19 ENCOUNTER — Ambulatory Visit
Admission: RE | Admit: 2018-09-19 | Discharge: 2018-09-19 | Disposition: A | Discharge status: Home / Self Care | Payer: Medicare Other | Source: Ambulatory Visit | Attending: Oncology | Admitting: Oncology

## 2018-09-19 DIAGNOSIS — Z1231 Encounter for screening mammogram for malignant neoplasm of breast: Secondary | ICD-10-CM | POA: Diagnosis present

## 2019-05-24 NOTE — Progress Notes (Signed)
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  She continues to feel well and remains asymptomatic. She has no neurologic complaints. She denies any recent fevers or illnesses. She has a good appetite and denies weight loss.  She denies any chest pain, shortness of breath, cough, or hemoptysis.  She denies any nausea, vomiting, constipation, or diarrhea. She has no urinary complaints.  Patient feels at her baseline offers no specific complaints today.  REVIEW OF SYSTEMS:   Review of Systems  Constitutional: Negative.  Negative for fever, malaise/fatigue and weight loss.  Respiratory: Negative.  Negative for cough and shortness of breath.   Cardiovascular: Negative.  Negative for chest pain and leg swelling.  Gastrointestinal: Negative.  Negative for abdominal pain.  Genitourinary: Negative.  Negative for dysuria.  Musculoskeletal: Negative.  Negative for joint pain.  Skin: Negative.  Negative for rash.  Neurological: Negative.  Negative for dizziness, sensory change, focal weakness, weakness and headaches.  Psychiatric/Behavioral: Negative.  The patient is not nervous/anxious.     As per HPI. Otherwise, a complete review of systems is negative.  PAST MEDICAL HISTORY: Past Medical History:  Diagnosis Date  . Breast cancer ([place]) 2011   LT LUMPECTOMY  . Personal history of radiation therapy 2011   BREAST CA    PAST SURGICAL HISTORY: Past Surgical History:  Procedure Laterality Date  . BREAST BIOPSY Left 2011   breast ca radation    FAMILY HISTORY: Family History  Problem Relation Age of Onset  . Breast  cancer Sister 65  . Breast cancer Daughter 7       ADVANCED DIRECTIVES (Y/N):  N   HEALTH MAINTENANCE: Social History   Tobacco Use  . Smoking status: Former Research scientist (life sciences)  . Smokeless tobacco: Never Used  Substance Use Topics  . Alcohol use: No  . Drug use: No     Colonoscopy:  PAP:  Bone density:  Lipid panel:  No Known Allergies  Current Outpatient Medications  Medication Sig Dispense Refill  . alendronate (FOSAMAX) 70 MG tablet Take 1 tablet by mouth once a week.    Marland Kitchen aspirin EC 81 MG tablet Take 1 tablet by mouth once a week.    Marland Kitchen atorvastatin (LIPITOR) 20 MG tablet Take 1 tablet by mouth 1 day or 1 dose.    Marland Kitchen SYNTHROID 100 MCG tablet Take 1 tablet by mouth daily.    . Vitamin D, Ergocalciferol, (DRISDOL) 50000 UNITS CAPS capsule   0   No current facility-administered medications for this visit.     OBJECTIVE: Vitals:   05/29/19 1053  BP: 112/79  Pulse: 78  Temp: (!) 97.3 F (36.3 C)     Body mass index is 23.17 kg/m.    ECOG FS:0 - Asymptomatic  General: Well-developed, well-nourished, no acute distress. Eyes: Pink conjunctiva, anicteric sclera. HEENT: Normocephalic, moist mucous membranes. Breast: Bilateral breast and axilla without lumps or masses. Lungs: Clear to auscultation bilaterally. Heart: Regular rate and rhythm. No rubs, murmurs, or gallops. Abdomen: Soft, nontender, nondistended. No organomegaly noted, normoactive bowel sounds. Musculoskeletal: No edema, cyanosis, or clubbing. Neuro: Alert, answering all questions appropriately. Cranial nerves grossly intact. Skin: No rashes or petechiae noted. Psych:  Normal affect.  LAB RESULTS:  Lab Results  Component Value Date   NA 135 06/01/2016   K 4.0 06/01/2016   CL 104 06/01/2016   CO2 26 06/01/2016   GLUCOSE 93 06/01/2016   BUN 12 06/01/2016   CREATININE 0.68 06/01/2016   CALCIUM 9.3 06/01/2016   PROT 7.6 06/01/2016   ALBUMIN 4.3 06/01/2016   AST 18 06/01/2016   ALT 16 06/01/2016    ALKPHOS 72 06/01/2016   BILITOT 0.9 06/01/2016   GFRNONAA >60 06/01/2016   GFRAA >60 06/01/2016    Lab Results  Component Value Date   WBC 6.3 06/01/2016   NEUTROABS 3.7 06/01/2016   HGB 14.9 06/01/2016   HCT 43.2 06/01/2016   MCV 90.2 06/01/2016   PLT 268 06/01/2016     STUDIES: No results found.  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 She completed adjuvant XRT in May 2012. She subsequently took 5 years of letrozole completing in July 2017.  Patient's most recent mammogram on September 19, 2018 was reported as BI-RADS 2.  Repeat in December 2020.  Return to clinic in 1 year for routine evaluation at which time patient can likely be discharged from clinic.   I spent a total of 20 minutes face-to-face with the patient of which greater than 50% of the visit was spent in counseling and coordination of care as detailed above.   Patient expressed understanding and was in agreement with this plan. She also understands that She can call clinic at any time with any questions, concerns, or complaints.   Cancer Staging Primary cancer of upper outer quadrant of left female breast [facility]) Staging form: Breast, AJCC 7th Edition - Clinical stage from 06/06/2016: Stage IA (T1c, N0, M0) - Signed by Lloyd Huger, MD on 06/06/2016   Lloyd Huger, MD   05/30/2019 7:03 AM

## 2019-05-28 ENCOUNTER — Other Ambulatory Visit: Payer: Self-pay

## 2019-05-29 ENCOUNTER — Other Ambulatory Visit: Payer: Self-pay

## 2019-05-29 ENCOUNTER — Encounter: Payer: Self-pay | Admitting: Oncology

## 2019-05-29 ENCOUNTER — Inpatient Hospital Stay: Payer: Medicare Other | Attending: Oncology | Admitting: Oncology

## 2019-05-29 VITALS — BP 112/79 | HR 78 | Temp 97.3°F | Ht 64.0 in | Wt 135.0 lb

## 2019-05-29 DIAGNOSIS — Z7982 Long term (current) use of aspirin: Secondary | ICD-10-CM | POA: Insufficient documentation

## 2019-05-29 DIAGNOSIS — Z803 Family history of malignant neoplasm of breast: Secondary | ICD-10-CM | POA: Diagnosis not present

## 2019-05-29 DIAGNOSIS — Z923 Personal history of irradiation: Secondary | ICD-10-CM | POA: Diagnosis not present

## 2019-05-29 DIAGNOSIS — Z87891 Personal history of nicotine dependence: Secondary | ICD-10-CM | POA: Insufficient documentation

## 2019-05-29 DIAGNOSIS — Z853 Personal history of malignant neoplasm of breast: Secondary | ICD-10-CM | POA: Diagnosis present

## 2019-05-29 DIAGNOSIS — C50412 Malignant neoplasm of upper-outer quadrant of left female breast: Secondary | ICD-10-CM | POA: Diagnosis not present

## 2019-05-29 DIAGNOSIS — Z79899 Other long term (current) drug therapy: Secondary | ICD-10-CM | POA: Diagnosis not present

## 2019-05-29 NOTE — Progress Notes (Signed)
Patient is here today to follow up on her left breast cancer. Patient stated that she had not had any issues.

## 2019-09-23 ENCOUNTER — Ambulatory Visit
Admission: RE | Admit: 2019-09-23 | Discharge: 2019-09-23 | Disposition: A | Discharge status: Home / Self Care | Payer: Medicare Other | Source: Ambulatory Visit | Attending: Oncology | Admitting: Oncology

## 2019-09-23 DIAGNOSIS — C50412 Malignant neoplasm of upper-outer quadrant of left female breast: Secondary | ICD-10-CM | POA: Diagnosis present

## 2019-09-23 DIAGNOSIS — Z1231 Encounter for screening mammogram for malignant neoplasm of breast: Secondary | ICD-10-CM | POA: Insufficient documentation

## 2020-05-30 NOTE — Progress Notes (Deleted)
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  She continues to feel well and remains asymptomatic. She has no neurologic complaints. She denies any recent fevers or illnesses. She has a good appetite and denies weight loss.  She denies any chest pain, shortness of breath, cough, or hemoptysis.  She denies any nausea, vomiting, constipation, or diarrhea. She has no urinary complaints.  Patient feels at her baseline offers no specific complaints today.  REVIEW OF SYSTEMS:   Review of Systems  Constitutional: Negative.  Negative for fever, malaise/fatigue and weight loss.  Respiratory: Negative.  Negative for cough and shortness of breath.   Cardiovascular: Negative.  Negative for chest pain and leg swelling.  Gastrointestinal: Negative.  Negative for abdominal pain.  Genitourinary: Negative.  Negative for dysuria.  Musculoskeletal: Negative.  Negative for joint pain.  Skin: Negative.  Negative for rash.  Neurological: Negative.  Negative for dizziness, sensory change, focal weakness, weakness and headaches.  Psychiatric/Behavioral: Negative.  The patient is not nervous/anxious.     As per HPI. Otherwise, a complete review of systems is negative.  PAST MEDICAL HISTORY: Past Medical History:  Diagnosis Date  . Breast cancer ([place]) 2011   LT LUMPECTOMY  . Personal history of radiation therapy 2011   BREAST CA    PAST SURGICAL HISTORY: Past Surgical History:  Procedure Laterality Date  . BREAST BIOPSY Left 2011   breast ca radation    FAMILY HISTORY: Family History  Problem Relation Age of Onset  . Breast  cancer Sister 77  . Breast cancer Daughter 16       ADVANCED DIRECTIVES (Y/N):  N   HEALTH MAINTENANCE: Social History   Tobacco Use  . Smoking status: Former Research scientist (life sciences)  . Smokeless tobacco: Never Used  Vaping Use  . Vaping Use: Never used  Substance Use Topics  . Alcohol use: No  . Drug use: No     Colonoscopy:  PAP:  Bone density:  Lipid panel:  No Known Allergies  Current Outpatient Medications  Medication Sig Dispense Refill  . alendronate (FOSAMAX) 70 MG tablet Take 1 tablet by mouth once a week.    Marland Kitchen aspirin EC 81 MG tablet Take 1 tablet by mouth once a week.    Marland Kitchen atorvastatin (LIPITOR) 20 MG tablet Take 1 tablet by mouth 1 day or 1 dose.    Marland Kitchen SYNTHROID 100 MCG tablet Take 1 tablet by mouth daily.    . Vitamin D, Ergocalciferol, (DRISDOL) 50000 UNITS CAPS capsule   0   No current facility-administered medications for this visit.    OBJECTIVE: There were no vitals filed for this visit.   There is no height or weight on file to calculate BMI.    ECOG FS:0 - Asymptomatic  General: Well-developed, well-nourished, no acute distress. Eyes: Pink conjunctiva, anicteric sclera. HEENT: Normocephalic, moist mucous membranes. Breast: Bilateral breast and axilla without lumps or masses. Lungs: Clear to auscultation bilaterally. Heart: Regular rate and rhythm. No rubs, murmurs, or gallops. Abdomen: Soft, nontender, nondistended. No organomegaly noted, normoactive bowel sounds. Musculoskeletal: No edema, cyanosis, or clubbing. Neuro: Alert, answering all questions appropriately. Cranial nerves grossly intact. Skin: No rashes or petechiae noted.  Psych: Normal affect.  LAB RESULTS:  Lab Results  Component Value Date   NA 135 06/01/2016   K 4.0 06/01/2016   CL 104 06/01/2016   CO2 26 06/01/2016   GLUCOSE 93 06/01/2016   BUN 12 06/01/2016   CREATININE 0.68 06/01/2016   CALCIUM 9.3 06/01/2016   PROT 7.6 06/01/2016   ALBUMIN 4.3 06/01/2016   AST 18 06/01/2016   ALT  16 06/01/2016   ALKPHOS 72 06/01/2016   BILITOT 0.9 06/01/2016   GFRNONAA >60 06/01/2016   GFRAA >60 06/01/2016    Lab Results  Component Value Date   WBC 6.3 06/01/2016   NEUTROABS 3.7 06/01/2016   HGB 14.9 06/01/2016   HCT 43.2 06/01/2016   MCV 90.2 06/01/2016   PLT 268 06/01/2016     STUDIES: No results found.  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 She completed adjuvant XRT in May 2012. She subsequently took 5 years of letrozole completing in July 2017.  Patient's most recent mammogram on September 19, 2018 was reported as BI-RADS 2.  Repeat in December 2020.  Return to clinic in 1 year for routine evaluation at which time patient can likely be discharged from clinic.   I spent a total of 20 minutes face-to-face with the patient of which greater than 50% of the visit was spent in counseling and coordination of care as detailed above.   Patient expressed understanding and was in agreement with this plan. She also understands that She can call clinic at any time with any questions, concerns, or complaints.   Cancer Staging Primary cancer of upper outer quadrant of left female breast [facility]) Staging form: Breast, AJCC 7th Edition - Clinical stage from 06/06/2016: Stage IA (T1c, N0, M0) - Signed by Lloyd Huger, MD on 06/06/2016   Lloyd Huger, MD   05/30/2020 10:15 AM

## 2020-06-02 ENCOUNTER — Inpatient Hospital Stay: Payer: Medicare HMO | Admitting: Oncology

## 2020-06-06 NOTE — Progress Notes (Signed)
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 She continues to feel well and remains asymptomatic. She has no neurologic complaints. She denies any recent fevers or illnesses. She has a good appetite and denies weight loss.  She denies any chest pain, shortness of breath, cough, or hemoptysis.  She denies any nausea, vomiting, constipation, or diarrhea. She has no urinary complaints. Patient feels at her baseline and offers no specific complaints today.  REVIEW OF SYSTEMS:   Review of Systems  Constitutional: Negative.  Negative for fever, malaise/fatigue and weight loss.  Respiratory: Negative.  Negative for cough and shortness of breath.   Cardiovascular: Negative.  Negative for chest pain and leg swelling.  Gastrointestinal: Negative.  Negative for abdominal pain.  Genitourinary: Negative.  Negative for dysuria.  Musculoskeletal: Negative.  Negative for joint pain.  Skin: Negative.  Negative for rash.  Neurological: Negative.  Negative for dizziness, sensory change, focal weakness, weakness and headaches.  Psychiatric/Behavioral: Negative.  The patient is not nervous/anxious.     As per HPI. Otherwise, a complete review of systems is negative.  PAST MEDICAL HISTORY: Past Medical History:  Diagnosis Date  . Breast cancer ([place]) 2011   LT LUMPECTOMY  . Personal history of radiation therapy 2011   BREAST CA    PAST SURGICAL HISTORY: Past Surgical History:  Procedure Laterality Date  . BREAST BIOPSY Left 2011   breast ca radation    FAMILY HISTORY: Family History  Problem Relation Age of Onset  . Breast  cancer Sister 69  . Breast cancer Daughter 35       ADVANCED DIRECTIVES (Y/N):  N   HEALTH MAINTENANCE: Social History   Tobacco Use  . Smoking status: Former Research scientist (life sciences)  . Smokeless tobacco: Never Used  Vaping Use  . Vaping Use: Never used  Substance Use Topics  . Alcohol use: No  . Drug use: No     Colonoscopy:  PAP:  Bone density:  Lipid panel:  No Known Allergies  Current Outpatient Medications  Medication Sig Dispense Refill  . alendronate (FOSAMAX) 70 MG tablet Take 1 tablet by mouth once a week.    Marland Kitchen aspirin EC 81 MG tablet Take 1 tablet by mouth once a week.    Marland Kitchen atorvastatin (LIPITOR) 20 MG tablet Take 1 tablet by mouth 1 day or 1 dose.    Marland Kitchen SYNTHROID 100 MCG tablet Take 1 tablet by mouth daily.    . Vitamin D, Ergocalciferol, (DRISDOL) 50000 UNITS CAPS capsule   0   No current facility-administered medications for this visit.    OBJECTIVE: Vitals:   06/10/20 1053  BP: 125/79  Pulse: 66  Resp: 20  Temp: (!) 96.8 F (36 C)     Body mass index is 24.05 kg/m.    ECOG FS:0 - Asymptomatic  General: Well-developed, well-nourished, no acute distress. Eyes: Pink conjunctiva, anicteric sclera. HEENT: Normocephalic, moist mucous membranes. Breast: Patient declined exam today. Lungs: No audible wheezing or coughing. Heart: Regular rate and rhythm. Abdomen: Soft, nontender, no obvious distention. Musculoskeletal: No edema, cyanosis, or clubbing. Neuro: Alert, answering all questions appropriately. Cranial nerves grossly intact. Skin: No rashes or petechiae noted. Psych: Normal affect.  LAB RESULTS:  Lab Results  Component Value Date   NA 135 06/01/2016   K 4.0 06/01/2016   CL 104 06/01/2016   CO2 26 06/01/2016   GLUCOSE 93 06/01/2016   BUN 12 06/01/2016   CREATININE 0.68 06/01/2016   CALCIUM 9.3 06/01/2016   PROT 7.6 06/01/2016   ALBUMIN 4.3 06/01/2016   AST 18 06/01/2016   ALT 16 06/01/2016   ALKPHOS 72 06/01/2016   BILITOT 0.9 06/01/2016    GFRNONAA >60 06/01/2016   GFRAA >60 06/01/2016    Lab Results  Component Value Date   WBC 6.3 06/01/2016   NEUTROABS 3.7 06/01/2016   HGB 14.9 06/01/2016   HCT 43.2 06/01/2016   MCV 90.2 06/01/2016   PLT 268 06/01/2016     STUDIES: No results found.  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 She completed adjuvant XRT in May 2012. She subsequently took 5 years of letrozole completing in July 2017. Patient's most recent mammogram on September 23, 2019 was reported as BI-RADS 2. Repeat in December 2021. Patient is now nearly 10 years out from completing her XRT and can be discharged from clinic. Mammogram has been scheduled for later this year, but subsequent mammograms can be ordered and scheduled by primary care. No further follow-up has been scheduled. Please refer patient back if there are any questions or concerns.  I spent a total of 20 minutes reviewing chart data, face-to-face evaluation with the patient, counseling and coordination of care as detailed above.   Patient expressed understanding and was in agreement with this plan. She also understands that She can call clinic at any time with any questions, concerns, or complaints.   Cancer Staging Primary cancer of upper outer quadrant of left female breast [facility]) Staging form: Breast, AJCC 7th Edition - Clinical stage from 06/06/2016: Stage IA (T1c, N0, M0) - Signed by Lloyd Huger, MD on 06/06/2016   Lloyd Huger, MD   06/11/2020 6:52 AM

## 2020-06-09 ENCOUNTER — Encounter: Payer: Self-pay | Admitting: Oncology

## 2020-06-09 NOTE — Progress Notes (Signed)
Patient denies any questions or concerns at this time. Just states she was thinking this was her last appointment and would like to discuss that with provider.

## 2020-06-10 ENCOUNTER — Inpatient Hospital Stay: Payer: Medicare HMO | Attending: Oncology | Admitting: Oncology

## 2020-06-10 ENCOUNTER — Other Ambulatory Visit: Payer: Self-pay

## 2020-06-10 VITALS — BP 125/79 | HR 66 | Temp 96.8°F | Resp 20 | Wt 140.1 lb

## 2020-06-10 DIAGNOSIS — Z87891 Personal history of nicotine dependence: Secondary | ICD-10-CM | POA: Diagnosis not present

## 2020-06-10 DIAGNOSIS — Z923 Personal history of irradiation: Secondary | ICD-10-CM | POA: Insufficient documentation

## 2020-06-10 DIAGNOSIS — Z7982 Long term (current) use of aspirin: Secondary | ICD-10-CM | POA: Diagnosis not present

## 2020-06-10 DIAGNOSIS — Z79811 Long term (current) use of aromatase inhibitors: Secondary | ICD-10-CM | POA: Insufficient documentation

## 2020-06-10 DIAGNOSIS — Z79899 Other long term (current) drug therapy: Secondary | ICD-10-CM | POA: Insufficient documentation

## 2020-06-10 DIAGNOSIS — C50412 Malignant neoplasm of upper-outer quadrant of left female breast: Secondary | ICD-10-CM | POA: Insufficient documentation

## 2020-06-10 DIAGNOSIS — Z170 Estrogen receptor positive status [ER+]: Secondary | ICD-10-CM | POA: Insufficient documentation

## 2020-09-23 ENCOUNTER — Other Ambulatory Visit: Payer: Self-pay

## 2020-09-23 ENCOUNTER — Ambulatory Visit
Admission: RE | Admit: 2020-09-23 | Discharge: 2020-09-23 | Disposition: A | Discharge status: Home / Self Care | Payer: Medicare HMO | Source: Ambulatory Visit | Attending: Oncology | Admitting: Oncology

## 2020-09-23 DIAGNOSIS — Z1231 Encounter for screening mammogram for malignant neoplasm of breast: Secondary | ICD-10-CM | POA: Diagnosis not present

## 2020-09-23 DIAGNOSIS — C50412 Malignant neoplasm of upper-outer quadrant of left female breast: Secondary | ICD-10-CM | POA: Insufficient documentation

## 2021-08-16 ENCOUNTER — Other Ambulatory Visit: Payer: Self-pay | Admitting: Endocrinology

## 2021-08-16 DIAGNOSIS — Z1231 Encounter for screening mammogram for malignant neoplasm of breast: Secondary | ICD-10-CM

## 2021-09-24 ENCOUNTER — Other Ambulatory Visit: Payer: Self-pay

## 2021-09-24 ENCOUNTER — Ambulatory Visit
Admission: RE | Admit: 2021-09-24 | Discharge: 2021-09-24 | Disposition: A | Discharge status: Home / Self Care | Payer: Medicare HMO | Source: Ambulatory Visit | Attending: Endocrinology | Admitting: Endocrinology

## 2021-09-24 DIAGNOSIS — Z1231 Encounter for screening mammogram for malignant neoplasm of breast: Secondary | ICD-10-CM | POA: Diagnosis not present

## 2022-08-17 ENCOUNTER — Other Ambulatory Visit: Payer: Self-pay | Admitting: Endocrinology

## 2022-08-17 DIAGNOSIS — Z1231 Encounter for screening mammogram for malignant neoplasm of breast: Secondary | ICD-10-CM

## 2022-09-27 ENCOUNTER — Ambulatory Visit
Admission: RE | Admit: 2022-09-27 | Discharge: 2022-09-27 | Disposition: A | Discharge status: Home / Self Care | Payer: Medicare HMO | Source: Ambulatory Visit | Attending: Endocrinology | Admitting: Endocrinology

## 2022-09-27 DIAGNOSIS — Z1231 Encounter for screening mammogram for malignant neoplasm of breast: Secondary | ICD-10-CM | POA: Diagnosis not present

## 2023-04-07 ENCOUNTER — Other Ambulatory Visit: Payer: Self-pay

## 2023-04-07 ENCOUNTER — Emergency Department
Admission: EM | Admit: 2023-04-07 | Discharge: 2023-04-07 | Disposition: A | Discharge status: Home / Self Care | Payer: Medicare HMO | Attending: Emergency Medicine | Admitting: Emergency Medicine

## 2023-04-07 ENCOUNTER — Emergency Department: Payer: Medicare HMO

## 2023-04-07 DIAGNOSIS — R079 Chest pain, unspecified: Secondary | ICD-10-CM | POA: Insufficient documentation

## 2023-04-07 DIAGNOSIS — S59911A Unspecified injury of right forearm, initial encounter: Secondary | ICD-10-CM | POA: Diagnosis present

## 2023-04-07 DIAGNOSIS — Y92410 Unspecified street and highway as the place of occurrence of the external cause: Secondary | ICD-10-CM | POA: Insufficient documentation

## 2023-04-07 DIAGNOSIS — S50811A Abrasion of right forearm, initial encounter: Secondary | ICD-10-CM | POA: Insufficient documentation

## 2023-04-07 MED ORDER — IBUPROFEN 600 MG PO TABS
600.0000 mg | ORAL_TABLET | Freq: Once | ORAL | Status: AC
  Administered 2023-04-07: 600 mg via ORAL
  Filled 2023-04-07: qty 1
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
## 2023-04-07 NOTE — Discharge Instructions (Signed)
Your chest x-ray did not show any fractures.  It did suggest presence of a hiatal hernia.  This is not something that needs immediate treatment, but you could mention this to your primary care provider at your next visit.  You can take 650 mg of Tylenol and 600 mg of ibuprofen every 6 hours as needed for pain.  Please return to the ED with any new chest pain, shortness of breath, or abdominal pain.
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
